# Patient Record
Sex: Male | Born: 1990 | Race: White | Hispanic: No | Smoking: Never smoker
Health system: Southern US, Community
[De-identification: ages and names within clinical notes are randomized; demographics above are authoritative.]

## PROBLEM LIST (undated history)

## (undated) DIAGNOSIS — Z22322 Carrier or suspected carrier of Methicillin resistant Staphylococcus aureus: Secondary | ICD-10-CM

## (undated) HISTORY — PX: DEBRIDEMENT LEG: SUR390

---

## 2015-02-13 ENCOUNTER — Encounter: Payer: Self-pay | Admitting: Emergency Medicine

## 2015-02-13 ENCOUNTER — Emergency Department
Admission: EM | Admit: 2015-02-13 | Discharge: 2015-02-13 | Disposition: A | Discharge status: Home / Self Care | Payer: BLUE CROSS/BLUE SHIELD | Source: Home / Self Care | Attending: Family Medicine | Admitting: Family Medicine

## 2015-02-13 ENCOUNTER — Emergency Department (INDEPENDENT_AMBULATORY_CARE_PROVIDER_SITE_OTHER): Payer: BLUE CROSS/BLUE SHIELD

## 2015-02-13 DIAGNOSIS — M779 Enthesopathy, unspecified: Principal | ICD-10-CM

## 2015-02-13 DIAGNOSIS — M25571 Pain in right ankle and joints of right foot: Secondary | ICD-10-CM | POA: Diagnosis not present

## 2015-02-13 DIAGNOSIS — M6588 Other synovitis and tenosynovitis, other site: Secondary | ICD-10-CM

## 2015-02-13 DIAGNOSIS — M778 Other enthesopathies, not elsewhere classified: Secondary | ICD-10-CM

## 2015-02-13 HISTORY — DX: Carrier or suspected carrier of methicillin resistant Staphylococcus aureus: Z22.322

## 2015-02-13 MED ORDER — MELOXICAM 15 MG PO TABS: 15.0000 mg | ORAL_TABLET | Freq: Every day | ORAL | Status: DC

## 2015-02-13 MED ORDER — TRAMADOL HCL 50 MG PO TABS: 50.0000 mg | ORAL_TABLET | Freq: Four times a day (QID) | ORAL | Status: DC | PRN

## 2015-02-13 NOTE — ED Notes (Signed)
Patient began feeling pain on top of right foot 4 days ago; no known injury or improper movement. Has tried tylenol and ibuprofen without relief.

## 2015-02-13 NOTE — Discharge Instructions (Signed)
Apply ice pack for 15 minutes, 3 to 4 times daily  Continue until pain decreases. Begin stretching and range of motion exercises.  Avoid lacing shoes too tightly.

## 2015-02-13 NOTE — ED Provider Notes (Signed)
CSN: [id number]     Arrival date & time 02/13/15  1747 History   First MD Initiated Contact with Patient 02/13/15 1805     Chief Complaint  Patient presents with  . Foot Pain      HPI Comments: Patient was at work four days ago when he noticed pain in the dorsum of his right foot at 7:30am.  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  He recalls no injury or change in activities.  The has persisted and is worse when standing and walking.  Patient is a 24 y.o. male presenting with lower extremity pain. The history is provided by the patient.  Foot Pain This is a new problem. Episode onset: 4 days ag. The problem occurs constantly. The problem has not changed since onset.The symptoms are aggravated by walking and standing. The symptoms are relieved by rest. He has tried acetaminophen (ibuprove) for the symptoms. The treatment provided no relief.    Past Medical History  Diagnosis Date  . MRSA (methicillin resistant staph aureus) culture positive    Past Surgical History  Procedure Laterality Date  . Debridement leg      for MRSA   Family History  Problem Relation Age of Onset  . Diabetes Father   . Diabetes Paternal Uncle    History  Substance Use Topics  . Smoking status: Never Smoker   . Smokeless tobacco: Not on file  . Alcohol Use: No    Review of Systems  All other systems reviewed and are negative.   Allergies  Review of patient's allergies indicates no known allergies.  Home Medications   Prior to Admission medications   Medication Sig Start Date End Date Taking? Authorizing Provider  meloxicam (MOBIC) 15 MG tablet Take 1 tablet (15 mg total) by mouth daily. Take with food 02/13/15   Lattie Haw, MD  traMADol (ULTRAM) 50 MG tablet Take 1 tablet (50 mg total) by mouth every 6 (six) hours as needed for moderate pain. 02/13/15   Lattie Haw, MD   BP 100/60 mmHg  Pulse 52  Temp(Src) 97.9 F (36.6 C) (Oral)  Resp 18  Ht  (1.88 m)   Wt 332 lb (150.594 kg)  BMI 42.61 kg/m2  SpO2 98% Physical Exam  Constitutional: He is oriented to person, place, and time. He appears well-developed and well-nourished. No distress.  Patient is obese (BMI 42.6)  HENT:  Head: Normocephalic.  Eyes: Pupils are equal, round, and reactive to light.  Musculoskeletal:       Right foot: There is tenderness and bony tenderness. There is normal range of motion, no swelling, normal capillary refill, no crepitus and no deformity.       Feet:  The dorsum of right foot has tenderness to palpation  as noted on diagram.  Pain is elicited by palpation of extensor tendons of 2nd, 3rd, and 4th toes, especially during resisted dorsiflexion of those toes.    Neurological: He is alert and oriented to person, place, and time.  Skin: Skin is warm and dry. No rash noted.  Nursing note and vitals reviewed.   ED Course  Procedures  none  Imaging Review Dg Foot Complete Right  02/13/2015   CLINICAL DATA:  Acute right foot pain without known injury. Initial encounter.  EXAM: RIGHT FOOT COMPLETE - 3+ VIEW  COMPARISON:  None.  FINDINGS: There is no evidence of fracture or dislocation. There is no evidence of arthropathy or other focal  bone abnormality. Soft tissues are unremarkable.  IMPRESSION: No significant abnormality seen in the right foot.   Electronically Signed   By: Lupita RaiderJames  Green Jr, M.D.   On: 02/13/2015 19:48     MDM   1. Extensor tendonitis of foot    Begin Mobic 15mg  daily, and tramadol 50mg  q6hr prn Apply ice pack for 15 minutes, 3 to 4 times daily  Continue until pain decreases. Begin stretching and range of motion exercises (instruction sheets given).  Avoid lacing shoes too tightly. Followup with Dr. Rodney Langtonhomas Thekkekandam ([facility]) if not improving about two weeks.     Lattie HawStephen A Shevette Bess, MD 02/15/15 [phone]

## 2015-02-21 ENCOUNTER — Ambulatory Visit (INDEPENDENT_AMBULATORY_CARE_PROVIDER_SITE_OTHER): Payer: BLUE CROSS/BLUE SHIELD | Admitting: Family Medicine

## 2015-02-21 ENCOUNTER — Encounter: Payer: Self-pay | Admitting: Family Medicine

## 2015-02-21 VITALS — BP 108/76 | HR 76 | Ht 74.0 in | Wt 330.0 lb

## 2015-02-21 DIAGNOSIS — M79671 Pain in right foot: Secondary | ICD-10-CM | POA: Diagnosis not present

## 2015-02-21 MED ORDER — TRAMADOL HCL 50 MG PO TABS: 50.0000 mg | ORAL_TABLET | Freq: Four times a day (QID) | ORAL | Status: DC | PRN

## 2015-02-21 MED ORDER — MELOXICAM 15 MG PO TABS: 15.0000 mg | ORAL_TABLET | Freq: Every day | ORAL | Status: DC

## 2015-02-21 NOTE — Patient Instructions (Signed)
You have an extensor digitorum tendinitis. Ice for 15 minutes at a time at least 3-4 times a day. Meloxicam 15 mg daily with food for total of 6 weeks. Postop shoe or cam boot if it feels better with either of these - wear as often as possible. Follow up with me in 4 weeks for reevaluation.

## 2015-02-22 DIAGNOSIS — M79671 Pain in right foot: Secondary | ICD-10-CM | POA: Insufficient documentation

## 2015-02-22 NOTE — Progress Notes (Signed)
PCP: No PCP Per Patient  Subjective:   HPI: Patient is a [age] y.o. male here for right foot pain.  Patient reports he started to get pain about 1 1/2 weeks ago dorsal aspect right foot. Noticed during and after long shifts at work where he stands and walks a lot (12 hour shifts) and has to push a pedal with right foot. + swelling. No known injury though. Tried meloxicam which has helped, icing, tramadol. No prior issues with this foot.  Past Medical History  Diagnosis Date  . MRSA (methicillin resistant staph aureus) culture positive     No current outpatient prescriptions on file prior to visit.   No current facility-administered medications on file prior to visit.    Past Surgical History  Procedure Laterality Date  . Debridement leg      for MRSA    No Known Allergies  History   Social History  . Marital Status: Single    Spouse Name: N/A  . Number of Children: N/A  . Years of Education: N/A   Occupational History  . Not on file.   Social History Main Topics  . Smoking status: Never Smoker   . Smokeless tobacco: Not on file  . Alcohol Use: No  . Drug Use: No  . Sexual Activity: Not on file   Other Topics Concern  . Not on file   Social History Narrative    Family History  Problem Relation Age of Onset  . Diabetes Father   . Diabetes Paternal Uncle     BP 108/76 mmHg  Pulse 76  Ht 6\' 2"  (1.88 m)  Wt 330 lb (149.687 kg)  BMI 42.35 kg/m2  Review of Systems: See HPI above.    Objective:  Physical Exam:  Gen: NAD  Right foot/ankle: No gross deformity, swelling, ecchymoses FROM with pain on dorsiflexion mildly of ankle. TTP dorsal foot over extensor tendons. Negative ant drawer and talar tilt.   Negative syndesmotic compression. Thompsons test negative. NV intact distally.  MSK u/s:  No evidence neovascularity, cortical irregularity, edema overlying cortex to suggest metatarsal stress fracture.  Does have a target sign of extensor  digitorum though.    Assessment & Plan:  1. Right foot pain - 2/2 extensor digitorum tendinitis.  Icing, meloxicam.  Postop shoe or cam walker as often as possible to help rest this.  F/u in 4 weeks.

## 2015-02-22 NOTE — Assessment & Plan Note (Signed)
2/2 extensor digitorum tendinitis.  Icing, meloxicam.  Postop shoe or cam walker as often as possible to help rest this.  F/u in 4 weeks.

## 2015-03-09 ENCOUNTER — Encounter: Payer: Self-pay | Admitting: Emergency Medicine

## 2015-03-09 ENCOUNTER — Emergency Department
Admission: EM | Admit: 2015-03-09 | Discharge: 2015-03-09 | Disposition: A | Discharge status: Home / Self Care | Payer: BLUE CROSS/BLUE SHIELD | Source: Home / Self Care | Attending: Emergency Medicine | Admitting: Emergency Medicine

## 2015-03-09 DIAGNOSIS — L237 Allergic contact dermatitis due to plants, except food: Secondary | ICD-10-CM

## 2015-03-09 MED ORDER — METHYLPREDNISOLONE SODIUM SUCC 125 MG IJ SOLR
125.0000 mg | INTRAMUSCULAR | Status: AC
  Administered 2015-03-09: 125 mg via INTRAMUSCULAR

## 2015-03-09 MED ORDER — PREDNISONE 10 MG (21) PO TBPK: ORAL_TABLET | ORAL | Status: DC

## 2015-03-09 MED ORDER — HYDROXYZINE HCL 10 MG PO TABS
10.0000 mg | ORAL_TABLET | Freq: Four times a day (QID) | ORAL | Status: DC | PRN
Start: 2015-03-09 — End: 2015-06-27

## 2015-03-09 NOTE — ED Notes (Signed)
Poison ivy, eyes, arms, legs, testicles x 2 days

## 2015-03-09 NOTE — ED Provider Notes (Signed)
CSN: [id number]     Arrival date & time 03/09/15  0845 History   First MD Initiated Contact with Patient 03/09/15 [phone]     Chief Complaint  Patient presents with  . Poison Ivy   (Consider location/radiation/quality/duration/timing/severity/associated sxs/prior Treatment) HPI Was working in a wooded area, 2 days ago, exposed to poison ivy and now has severe itchy poison ivy on face, arms and legs and abdomen and testicles. No cardiorespiratory symptoms. No other GU symptoms In the past, had severe poison ivy requiring a "cortisone shot and oral cortisone"  Past Medical History  Diagnosis Date  . MRSA (methicillin resistant staph aureus) culture positive    Past Surgical History  Procedure Laterality Date  . Debridement leg      for MRSA   Family History  Problem Relation Age of Onset  . Diabetes Father   . Diabetes Paternal Uncle    History  Substance Use Topics  . Smoking status: Never Smoker   . Smokeless tobacco: Not on file  . Alcohol Use: No    Review of Systems Remainder of Review of Systems negative for acute change except as noted in the HPI.  Allergies  Review of patient's allergies indicates not on file.  Home Medications   Prior to Admission medications   Medication Sig Start Date End Date Taking? Authorizing Provider  hydrOXYzine (ATARAX/VISTARIL) 10 MG tablet Take 1 tablet (10 mg total) by mouth every 6 (six) hours as needed for itching. May take 2 at bedtime. Caution: May cause drowsiness 03/09/15   Lajean Manesavid Massey, MD  meloxicam (MOBIC) 15 MG tablet Take 1 tablet (15 mg total) by mouth daily. Take with food 02/21/15   Lenda KelpShane R Hudnall, MD  predniSONE (STERAPRED UNI-PAK 21 TAB) 10 MG (21) TBPK tablet Take as directed for 6 days.--Take 6 on day 1, 5 on day 2, 4 on day 3, then 3 on day 4, then 2  on day 5, then 1 on day 6. 03/09/15   Lajean Manesavid Massey, MD  traMADol (ULTRAM) 50 MG tablet Take 1 tablet (50 mg total) by mouth every 6 (six) hours as needed for moderate pain.  02/21/15   Lenda KelpShane R Hudnall, MD   BP 121/72 mmHg  Pulse 43  Temp(Src) 98 F (36.7 C) (Oral)  Ht 6\' 2"  (1.88 m)  Wt 324 lb (146.965 kg)  BMI 41.58 kg/m2  SpO2 100% Physical Exam  Constitutional: He is oriented to person, place, and time. He appears well-developed and well-nourished. No distress.  HENT:  Head: Normocephalic and atraumatic.  Eyes: Conjunctivae and EOM are normal. Pupils are equal, round, and reactive to light. No scleral icterus.  Neck: Normal range of motion.  Cardiovascular: Normal rate.   Pulmonary/Chest: Effort normal. He has no decreased breath sounds. He has no wheezes.  Abdominal: He exhibits no distension.  Musculoskeletal: Normal range of motion.  Neurological: He is alert and oriented to person, place, and time.  Skin: Skin is warm.  Severe poison ivy rash on face neck arms and legs and trunk and external testicle area.  Psychiatric: He has a normal mood and affect.  Nursing note and vitals reviewed.   ED Course  Procedures (including critical care time) Labs Review Labs Reviewed - No data to display  Imaging Review No results found.   MDM   1. Allergic dermatitis due to poison ivy    severe Treatment options discussed, as well as risks, benefits, alternatives. Patient voiced understanding and agreement with the following plans:  Solu-Medrol  125 IM now. New Prescriptions   HYDROXYZINE (ATARAX/VISTARIL) 10 MG TABLET    Take 1 tablet (10 mg total) by mouth every 6 (six) hours as needed for itching. May take 2 at bedtime. Caution: May cause drowsiness   PREDNISONE (STERAPRED UNI-PAK 21 TAB) 10 MG (21) TBPK TABLET    Take as directed for 6 days.--Take 6 on day 1, 5 on day 2, 4 on day 3, then 3 on day 4, then 2  on day 5, then 1 on day 6.   Work note given. Other symptomatic care discussed. Preventative measures discussed for the future. Follow-up with your primary care doctor or dermatologist in 5-7 days if not improving, or sooner if symptoms  become worse. Precautions discussed. Red flags discussed. Questions invited and answered. Patient voiced understanding and agreement.   Lajean Manes, MD 03/09/15 [phone]

## 2015-03-20 ENCOUNTER — Telehealth: Payer: Self-pay | Admitting: Emergency Medicine

## 2015-03-21 ENCOUNTER — Ambulatory Visit: Payer: BLUE CROSS/BLUE SHIELD | Admitting: Family Medicine

## 2015-06-27 ENCOUNTER — Emergency Department
Admission: EM | Admit: 2015-06-27 | Discharge: 2015-06-27 | Disposition: A | Discharge status: Home / Self Care | Payer: BLUE CROSS/BLUE SHIELD | Source: Home / Self Care | Attending: Family Medicine | Admitting: Family Medicine

## 2015-06-27 ENCOUNTER — Encounter: Payer: Self-pay | Admitting: *Deleted

## 2015-06-27 DIAGNOSIS — L255 Unspecified contact dermatitis due to plants, except food: Secondary | ICD-10-CM

## 2015-06-27 MED ORDER — METHYLPREDNISOLONE SODIUM SUCC 125 MG IJ SOLR
125.0000 mg | Freq: Once | INTRAMUSCULAR | Status: AC
  Administered 2015-06-27: 125 mg via INTRAMUSCULAR

## 2015-06-27 MED ORDER — PREDNISONE 20 MG PO TABS: 20.0000 mg | ORAL_TABLET | Freq: Two times a day (BID) | ORAL | Status: DC

## 2015-06-27 NOTE — ED Provider Notes (Signed)
CSN: [id number]     Arrival date & time 06/27/15  1141 History   First MD Initiated Contact with Patient 06/27/15 1157     Chief Complaint  Patient presents with  . Rash   (Consider location/radiation/quality/duration/timing/severity/associated sxs/prior Treatment) HPI Comments: Patient came into contact with poison ivy while using a week eater five days ago.  He has developed a persistent pruritic rash on his forearms, and some on his chest.  Patient is a 24 y.o. male presenting with poison ivy. The history is provided by the patient.  Poison [place] This is a recurrent problem. Episode onset: 5 days ago. The problem occurs constantly. The problem has not changed since onset.Associated symptoms comments: none. Exacerbated by: heat. Nothing relieves the symptoms. Treatments tried: calamine lotion. The treatment provided no relief.    Past Medical History  Diagnosis Date  . MRSA (methicillin resistant staph aureus) culture positive    Past Surgical History  Procedure Laterality Date  . Debridement leg      for MRSA   Family History  Problem Relation Age of Onset  . Diabetes Father   . Diabetes Paternal Uncle    Social History  Substance Use Topics  . Smoking status: Never Smoker   . Smokeless tobacco: None  . Alcohol Use: No    Review of Systems  All other systems reviewed and are negative.   Allergies  Review of patient's allergies indicates no known allergies.  Home Medications   Prior to Admission medications   Medication Sig Start Date End Date Taking? Authorizing Provider  meloxicam (MOBIC) 15 MG tablet Take 1 tablet (15 mg total) by mouth daily. Take with food 02/21/15   Lenda Kelp, MD  predniSONE (DELTASONE) 20 MG tablet Take 1 tablet (20 mg total) by mouth 2 (two) times daily. Take with food. Begin taking Wednesday 06/28/15. 06/27/15   Lattie Haw, MD  traMADol (ULTRAM) 50 MG tablet Take 1 tablet (50 mg total) by mouth every 6 (six) hours as needed for  moderate pain. 02/21/15   Lenda Kelp, MD   BP 128/84 mmHg  Pulse 50  Temp(Src) 98.3 F (36.8 C) (Oral)  Resp 16  Wt 315 lb (142.883 kg)  SpO2 100% Physical Exam  Constitutional: He is oriented to person, place, and time. He appears well-developed and well-nourished. No distress.  HENT:  Head: Normocephalic.  Mouth/Throat: Oropharynx is clear and moist.  Eyes: Pupils are equal, round, and reactive to light.  Pulmonary/Chest: No respiratory distress.  Neurological: He is alert and oriented to person, place, and time.  Skin: Skin is warm and dry.     There is macular erythema on patient's forearms without swelling, warmth or tenderness to palpation.  Minimal erythema on upper chest.  Nursing note and vitals reviewed.   ED Course  Procedures  None   MDM   1. Rhus dermatitis    SoluMedrol  IM Begin prednisone burst tomorrow. May take non-sedating antihistamine such as Zyrtec for itching. Return if not improved one week    Lattie Haw, MD 06/27/15 1212

## 2015-06-27 NOTE — Discharge Instructions (Signed)
May take non-sedating antihistamine such as Zyrtec for itching.   Poison [employer] ivy is a inflammation of the skin (contact dermatitis) caused by touching the allergens on the leaves of the ivy plant following previous exposure to the plant. The rash usually appears 48 hours after exposure. The rash is usually bumps (papules) or blisters (vesicles) in a linear pattern. Depending on your own sensitivity, the rash may simply cause redness and itching, or it may also progress to blisters which may break open. These must be well cared for to prevent secondary bacterial (germ) infection, followed by scarring. Keep any open areas dry, clean, dressed, and covered with an antibacterial ointment if needed. The eyes may also get puffy. The puffiness is worst in the morning and gets better as the day progresses. This dermatitis usually heals without scarring, within 2 to 3 weeks without treatment. HOME CARE INSTRUCTIONS  Thoroughly wash with soap and water as soon as you have been exposed to poison ivy. You have about one half hour to remove the plant resin before it will cause the rash. This washing will destroy the oil or antigen on the skin that is causing, or will cause, the rash. Be sure to wash under your fingernails as any plant resin there will continue to spread the rash. Do not rub skin vigorously when washing affected area. Poison ivy cannot spread if no oil from the plant remains on your body. A rash that has progressed to weeping sores will not spread the rash unless you have not washed thoroughly. It is also important to wash any clothes you have been wearing as these may carry active allergens. The rash will return if you wear the unwashed clothing, even several days later. Avoidance of the plant in the future is the best measure. Poison ivy plant can be recognized by the number of leaves. Generally, poison ivy has three leaves with flowering branches on a single stem. Diphenhydramine may be purchased  over the counter and used as needed for itching. Do not drive with this medication if it makes you drowsy.Ask your caregiver about medication for children. SEEK MEDICAL CARE IF:  Open sores develop.  Redness spreads beyond area of rash.  You notice purulent (pus-like) discharge.  You have increased pain.  Other signs of infection develop (such as fever). Document Released: 10/25/2000 Document Revised: 01/20/2012 Document Reviewed: 04/07/2009 [facility] Patient Information 2015 [place], [place]. This information is not intended to replace advice given to you by your health care provider. Make sure you discuss any questions you have with your health care provider.

## 2015-06-27 NOTE — ED Notes (Signed)
Pt c/o poison ivy/oak exposure with rash to both arms x 2 days. Used Calamine lotion otc without relief.

## 2016-05-23 ENCOUNTER — Emergency Department
Admission: EM | Admit: 2016-05-23 | Discharge: 2016-05-23 | Disposition: A | Discharge status: Home / Self Care | Payer: BLUE CROSS/BLUE SHIELD | Source: Home / Self Care | Attending: Family Medicine | Admitting: Family Medicine

## 2016-05-23 ENCOUNTER — Encounter: Payer: Self-pay | Admitting: Emergency Medicine

## 2016-05-23 DIAGNOSIS — L255 Unspecified contact dermatitis due to plants, except food: Secondary | ICD-10-CM | POA: Diagnosis not present

## 2016-05-23 MED ORDER — PREDNISONE 20 MG PO TABS: ORAL_TABLET | ORAL | Status: DC

## 2016-05-23 MED ORDER — METHYLPREDNISOLONE SODIUM SUCC 125 MG IJ SOLR
125.0000 mg | Freq: Once | INTRAMUSCULAR | Status: AC
  Administered 2016-05-23: 125 mg via INTRAMUSCULAR

## 2016-05-23 NOTE — ED Provider Notes (Signed)
CSN: [id number]     Arrival date & time 05/23/16  1311 History   First MD Initiated Contact with Patient 05/23/16 1359     Chief Complaint  Patient presents with  . Rash      HPI Comments: Patient was using a string trimmer 3 days ago when he came into contact with poison ivy.  He has mild pruritic rash on his forearms and legs.  He feels well otherwise.  Patient is a 25 y.o. male presenting with poison ivy. The history is provided by the patient.  Poison Lajoyce Cornersvy This is a new problem. Episode onset: 3 days ago. The problem occurs constantly. The problem has been gradually worsening. Associated symptoms comments: none.    Past Medical History  Diagnosis Date  . MRSA (methicillin resistant staph aureus) culture positive    Past Surgical History  Procedure Laterality Date  . Debridement leg      for MRSA   Family History  Problem Relation Age of Onset  . Diabetes Father   . Diabetes Paternal Uncle    Social History  Substance Use Topics  . Smoking status: Never Smoker   . Smokeless tobacco: None  . Alcohol Use: 0.0 oz/week    0 Standard drinks or equivalent per week    Review of Systems  All other systems reviewed and are negative.   Allergies  Review of patient's allergies indicates no known allergies.  Home Medications   Prior to Admission medications   Medication Sig Start Date End Date Taking? Authorizing Provider  meloxicam (MOBIC) 15 MG tablet Take 1 tablet (15 mg total) by mouth daily. Take with food 02/21/15   Lenda KelpShane R Hudnall, MD  predniSONE (DELTASONE) 20 MG tablet Take one tab by mouth twice daily for 5 days, then one daily for 3 days. Take with food. 05/23/16   Lattie HawStephen A Beese, MD  traMADol (ULTRAM) 50 MG tablet Take 1 tablet (50 mg total) by mouth every 6 (six) hours as needed for moderate pain. 02/21/15   Lenda KelpShane R Hudnall, MD   Meds Ordered and Administered this Visit   Medications  methylPREDNISolone sodium succinate (SOLU-MEDROL) 125 mg/2 mL injection 125 mg  (not administered)    BP 138/83 mmHg  Pulse 62  Temp(Src) 98.6 F (37 C) (Oral)  Ht 6\' 2"  (1.88 m)  Wt 360 lb (163.295 kg)  BMI 46.20 kg/m2  SpO2 96% No data found.   Physical Exam  Constitutional: He is oriented to person, place, and time. He appears well-developed and well-nourished.  HENT:  Head: Atraumatic.  Nose: Nose normal.  Mouth/Throat: Oropharynx is clear and moist.  Eyes: Conjunctivae are normal. Pupils are equal, round, and reactive to light.  Neck: Neck supple.  Musculoskeletal: He exhibits no edema.  Lymphadenopathy:    He has no cervical adenopathy.  Neurological: He is alert and oriented to person, place, and time.  Skin: Skin is warm and dry.     Bilateral forearms have mild erythema without swelling or tenderness to palpation.  Scattered maculopapular erythematous lesions on legs and arms.  Nursing note and vitals reviewed.   ED Course  Procedures none  MDM   1. Rhus dermatitis    Administered SoluMedrol 125mg  IM Begin prednisone burst/taper tomorrow. May take non-sedating antihistamine such as Zyrtec for itching. Return if not improved one week    Lattie HawStephen A Beese, MD 05/23/16 1635

## 2016-05-23 NOTE — Discharge Instructions (Signed)
Begin prednisone tomorrow (Friday 05/24/16) May take non-sedating antihistamine such as Zyrtec for itching.   Poison [employer] Poison ivy is a rash caused by touching the leaves of the poison ivy plant. The rash often shows up 48 hours later. You might just have bumps, redness, and itching. Sometimes, blisters appear and break open. Your eyes may get puffy (swollen). Poison ivy often heals in 2 to 3 weeks without treatment. HOME CARE  If you touch poison ivy:  Wash your skin with soap and water right away. Wash under your fingernails. Do not rub the skin very hard.  Wash any clothes you were wearing.  Avoid poison ivy in the future. Poison ivy has 3 leaves on a stem.  Use medicine to help with itching as told by your doctor. Do not drive when you take this medicine.  Keep open sores dry, clean, and covered with a bandage and medicated cream, if needed.  Ask your doctor about medicine for children. GET HELP RIGHT AWAY IF:  You have open sores.  Redness spreads beyond the area of the rash.  There is yellowish white fluid (pus) coming from the rash.  Pain gets worse.  You have a temperature by mouth above 102 F (38.9 C), not controlled by medicine. MAKE SURE YOU:  Understand these instructions.  Will watch your condition.  Will get help right away if you are not doing well or get worse.   This information is not intended to replace advice given to you by your health care provider. Make sure you discuss any questions you have with your health care provider.   Document Released: 11/30/2010 Document Revised: 01/20/2012 Document Reviewed: 04/05/2015 Elsevier Interactive Patient Education [employer]2016 [employer].

## 2016-05-23 NOTE — ED Notes (Signed)
Poison ivy on hands, forearms since Tuesday, he was weed eating on Monday, states he is highly allergic and always needs prednisone to get rid of it. Very worried it will spread.

## 2016-10-11 ENCOUNTER — Encounter: Payer: Self-pay | Admitting: *Deleted

## 2016-10-11 ENCOUNTER — Emergency Department
Admission: EM | Admit: 2016-10-11 | Discharge: 2016-10-11 | Disposition: A | Discharge status: Home / Self Care | Payer: BLUE CROSS/BLUE SHIELD | Source: Home / Self Care | Attending: Family Medicine | Admitting: Family Medicine

## 2016-10-11 DIAGNOSIS — J069 Acute upper respiratory infection, unspecified: Secondary | ICD-10-CM

## 2016-10-11 DIAGNOSIS — R0981 Nasal congestion: Secondary | ICD-10-CM

## 2016-10-11 DIAGNOSIS — B9789 Other viral agents as the cause of diseases classified elsewhere: Secondary | ICD-10-CM

## 2016-10-11 MED ORDER — PSEUDOEPHEDRINE HCL 60 MG PO TABS
60.0000 mg | ORAL_TABLET | Freq: Four times a day (QID) | ORAL | 0 refills | Status: DC | PRN
Start: 2016-10-11 — End: 2016-11-08

## 2016-10-11 MED ORDER — FLUTICASONE PROPIONATE 50 MCG/ACT NA SUSP: 2.0000 | Freq: Every day | NASAL | 2 refills | Status: DC

## 2016-10-11 NOTE — Discharge Instructions (Signed)
°  You may take 400-600mg  Ibuprofen (Motrin) every 6-8 hours for fever and pain  Alternate with Tylenol  You may take 500mg  Tylenol every 4-6 hours as needed for fever and pain  You may also use warm saltwater gargles to help with sore throat. Follow-up with your primary care provider next week for recheck of symptoms if not improving.  Be sure to drink plenty of fluids and rest, at least 8hrs of sleep a night, preferably more while you are sick. Return urgent care or go to closest ER if you cannot keep down fluids/signs of dehydration, fever not reducing with Tylenol, difficulty breathing/wheezing, stiff neck, worsening condition, or other concerns (see below)   Your symptoms are likely due to a virus such as the common cold, however, if you developing worsening chest congestion with shortness of breath, persistent fever (>100.4*F) for 3 days, or symptoms not improving in 5-7 days, please call our office for assistance as an antibiotic may be called in for you.

## 2016-10-11 NOTE — ED Provider Notes (Signed)
CSN: [id number]     Arrival date & time 10/11/16  1416 History   First MD Initiated Contact with Patient 10/11/16 1507     Chief Complaint  Patient presents with  . Nasal Congestion   (Consider location/radiation/quality/duration/timing/severity/associated sxs/prior Treatment) HPI  Edward Parsons is a 25 y.o. male presenting to UC with c/o 2 days of gradually worsening nasal congestion and sinus pressure. He has not tried anything for his symptoms today but he did take Nyquil once yesterday. Denies fever, chills, n/v/d. Others at work have been sick.     Past Medical History:  Diagnosis Date  . MRSA (methicillin resistant staph aureus) culture positive    Past Surgical History:  Procedure Laterality Date  . DEBRIDEMENT LEG     for MRSA   Family History  Problem Relation Age of Onset  . Diabetes Father   . Diabetes Paternal Uncle    Social History  Substance Use Topics  . Smoking status: Never Smoker  . Smokeless tobacco: Never Used  . Alcohol use No    Review of Systems  Constitutional: Negative for chills and fever.  HENT: Positive for congestion, rhinorrhea and sinus pressure. Negative for ear pain, sore throat, trouble swallowing and voice change.   Respiratory: Positive for cough. Negative for shortness of breath.   Cardiovascular: Negative for chest pain and palpitations.  Gastrointestinal: Negative for abdominal pain, diarrhea, nausea and vomiting.  Musculoskeletal: Negative for arthralgias, back pain and myalgias.  Skin: Negative for rash.  Neurological: Negative for dizziness, light-headedness and headaches.    Allergies  Patient has no known allergies.  Home Medications   Prior to Admission medications   Medication Sig Start Date End Date Taking? Authorizing Provider  fluticasone (FLONASE) 50 MCG/ACT nasal spray Place 2 sprays into both nostrils daily. 10/11/16   Junius FinnerErin O'Malley, PA-C  pseudoephedrine (SUDAFED) 60 MG tablet Take 1 tablet (60 mg total) by mouth  every 6 (six) hours as needed. 10/11/16   Junius FinnerErin O'Malley, PA-C   Meds Ordered and Administered this Visit  Medications - No data to display  BP 129/91 (BP Location: Left Arm)   Pulse 69   Temp 98.1 F (36.7 C) (Oral)   Resp 18   Ht 6\' 2"  (1.88 m)   Wt (!) 380 lb (172.4 kg)   SpO2 96%   BMI 48.79 kg/m  No data found.   Physical Exam  Constitutional: He is oriented to person, place, and time. He appears well-developed and well-nourished. No distress.  HENT:  Head: Normocephalic and atraumatic.  Right Ear: Tympanic membrane normal.  Left Ear: Tympanic membrane normal.  Nose: Nose normal. Right sinus exhibits no maxillary sinus tenderness and no frontal sinus tenderness. Left sinus exhibits no maxillary sinus tenderness and no frontal sinus tenderness.  Mouth/Throat: Uvula is midline, oropharynx is clear and moist and mucous membranes are normal.  Eyes: EOM are normal.  Neck: Normal range of motion. Neck supple.  Cardiovascular: Normal rate and regular rhythm.   Pulmonary/Chest: Effort normal and breath sounds normal. No respiratory distress. He has no wheezes. He has no rales.  Musculoskeletal: Normal range of motion.  Neurological: He is alert and oriented to person, place, and time.  Skin: Skin is warm and dry. He is not diaphoretic.  Psychiatric: He has a normal mood and affect. His behavior is normal.  Nursing note and vitals reviewed.   Urgent Care Course   Clinical Course     Procedures (including critical care time)  Labs Review Labs  Reviewed - No data to display  Imaging Review No results found.   MDM   1. Nasal congestion   2. Viral URI with cough    Pt c/o 2 days of URI symptoms. No evidence of bacterial infection at this time. Encouraged symptomatic treatment.  Rx: flonase and sudafed Encouraged sinus rinses F/u with PCP in 7-10 days if not improving. Sooner if worsening.     Junius Finnerrin O'Malley, PA-C 10/11/16 1914

## 2016-10-11 NOTE — ED Triage Notes (Signed)
Pt c/o nasal congestion and sinus pressure x 2 days. Denies fever.

## 2016-10-12 ENCOUNTER — Telehealth: Payer: Self-pay | Admitting: Emergency Medicine

## 2016-10-12 NOTE — Telephone Encounter (Signed)
Pt was seen yesterday, this morning coughing up dark brown mucous this morning.  Can an antb be called in?  Please advise.  TMartin,CMA

## 2016-10-12 NOTE — Telephone Encounter (Signed)
Per Denny PeonErin, a z-pack was called into [facility] [address] [place] use as directed.  Also to take Mucinex OTC to help with cough.  If no better will f/u w/PCP.  TMartin,CMA

## 2016-11-08 ENCOUNTER — Encounter: Payer: Self-pay | Admitting: *Deleted

## 2016-11-08 ENCOUNTER — Emergency Department
Admission: EM | Admit: 2016-11-08 | Discharge: 2016-11-08 | Disposition: A | Discharge status: Home / Self Care | Payer: BLUE CROSS/BLUE SHIELD | Source: Home / Self Care | Attending: Family Medicine | Admitting: Family Medicine

## 2016-11-08 DIAGNOSIS — L03811 Cellulitis of head [any part, except face]: Secondary | ICD-10-CM

## 2016-11-08 MED ORDER — DOXYCYCLINE HYCLATE 100 MG PO CAPS: 100.0000 mg | ORAL_CAPSULE | Freq: Two times a day (BID) | ORAL | 0 refills | Status: DC

## 2016-11-08 NOTE — ED Triage Notes (Signed)
Patient c/o bump that developed on top left side of his head 2 weeks ago shortly after a haircut. It has grown in size since and now has pain that radiates down his head into his neck. No drainage. C/o some recent fatigue. 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

## 2016-11-08 NOTE — ED Provider Notes (Signed)
CSN: [id number]     Arrival date & time 11/08/16  1018 History   First MD Initiated Contact with Patient 11/08/16 1052     Chief Complaint  Patient presents with  . Mass   (Consider location/radiation/quality/duration/timing/severity/associated sxs/prior Treatment) HPI Edward Parsons is a 25 y.o. male presenting to UC with c/o gradually worsening sore swollen area on the Left side top of his head that started about 2 weeks ago after a haircut.  Pain is aching and sore, mild to moderate in severity, worse with palpation. He has tried to squeeze it but no bleeding or discharge. Pain does radiate to behind his Left ear. He has been taking BC powder with mild relief.  He notes he must wear a respirator at work, which fits over the top of his head. He tries to position it so it does not rub the area but notes it is difficulty to keep it off that affected area.  Denies fever or chills.  Hx of MRSA in the past per his medical records.    Past Medical History:  Diagnosis Date  . MRSA (methicillin resistant staph aureus) culture positive    Past Surgical History:  Procedure Laterality Date  . DEBRIDEMENT LEG     for MRSA   Family History  Problem Relation Age of Onset  . Diabetes Father   . Diabetes Paternal Uncle    Social History  Substance Use Topics  . Smoking status: Never Smoker  . Smokeless tobacco: Never Used  . Alcohol use No    Review of Systems  Constitutional: Negative for chills and fever.  HENT: Negative for facial swelling and sore throat.   Gastrointestinal: Negative for nausea and vomiting.  Musculoskeletal: Negative for arthralgias and myalgias.  Skin: Positive for color change. Negative for wound.  Neurological: Positive for headaches (Left top of head-sore). Negative for dizziness and light-headedness.    Allergies  Patient has no known allergies.  Home Medications   Prior to Admission medications   Medication Sig Start Date End Date Taking? Authorizing  Provider  doxycycline (VIBRAMYCIN) 100 MG capsule Take 1 capsule (100 mg total) by mouth 2 (two) times daily. One po bid x 7 days 11/08/16   Junius FinnerErin O'Malley, PA-C   Meds Ordered and Administered this Visit  Medications - No data to display  BP 131/75 (BP Location: Left Arm)   Pulse 80   Temp 98.4 F (36.9 C) (Oral)   Wt (!) 387 lb (175.5 kg)   SpO2 96%   BMI 49.69 kg/m  No data found.   Physical Exam  Constitutional: He is oriented to person, place, and time. He appears well-developed and well-nourished.  HENT:  Head: Normocephalic. Head is with abrasion. Head is without contusion.    1cm are of faint erythema, mild swelling. Tender. Two superficial abrasions. No fluctuance. No bleeding or discharge.   Eyes: EOM are normal.  Neck: Normal range of motion. Neck supple.  Cardiovascular: Normal rate.   Pulmonary/Chest: Effort normal.  Musculoskeletal: Normal range of motion.  Neurological: He is alert and oriented to person, place, and time.  Skin: Skin is warm and dry. There is erythema ( see HENT exam).  Psychiatric: He has a normal mood and affect. His behavior is normal.  Nursing note and vitals reviewed.   Urgent Care Course   Clinical Course     Procedures (including critical care time)  Labs Review Labs Reviewed - No data to display  Imaging Review No results found.  MDM   1. Cellulitis of scalp    Exam c/w mild cellulitis of scalp. No indication for I&D at this time.  Will treat with oral antibiotic- Doxycycline Home care instructions provided. F/u in 4-5 days if not improving, sooner if worsening.  Advised not to try to squeeze it anymore. May clean with warm water and soap, especially after having to wear his respirator at work.     Junius Finnerrin O'Malley, PA-C 11/08/16 1105

## 2017-03-15 ENCOUNTER — Encounter: Payer: Self-pay | Admitting: Emergency Medicine

## 2017-03-15 ENCOUNTER — Emergency Department (INDEPENDENT_AMBULATORY_CARE_PROVIDER_SITE_OTHER)
Admission: EM | Admit: 2017-03-15 | Discharge: 2017-03-15 | Disposition: A | Discharge status: Home / Self Care | Payer: BLUE CROSS/BLUE SHIELD | Source: Home / Self Care | Attending: Family Medicine | Admitting: Family Medicine

## 2017-03-15 DIAGNOSIS — M5441 Lumbago with sciatica, right side: Secondary | ICD-10-CM | POA: Diagnosis not present

## 2017-03-15 MED ORDER — METHOCARBAMOL 500 MG PO TABS: 500.0000 mg | ORAL_TABLET | Freq: Two times a day (BID) | ORAL | 0 refills | Status: DC

## 2017-03-15 MED ORDER — TRAMADOL HCL 50 MG PO TABS: 50.0000 mg | ORAL_TABLET | Freq: Four times a day (QID) | ORAL | 0 refills | Status: DC | PRN

## 2017-03-15 MED ORDER — MELOXICAM 15 MG PO TABS: 15.0000 mg | ORAL_TABLET | Freq: Every day | ORAL | 0 refills | Status: DC

## 2017-03-15 NOTE — ED Provider Notes (Signed)
CSN: [id number]     Arrival date & time 03/15/17  0908 History   First MD Initiated Contact with Patient 03/15/17 [phone]     Chief Complaint  Patient presents with  . Back Pain   (Consider location/radiation/quality/duration/timing/severity/associated sxs/prior Treatment) HPI Edward Parsons is a 26 y.o. male presenting to UC with c/o sudden onset, gradually worsening Right lower back pain that started yesterday while working in a garden.  He notes he was tilling the soil then used a tool to pull the soil back. He thinks this is when he injured his back.  Pain is aching and sharp. Pain is 10/10, kept him up last night. Minimal relief with Icy-hot and ibuprofen.  He took 800mg  ibuprofen this morning just PTA.  Denies numbness down legs but pain does radiate from Right lower back to Right thigh.  No hx of back surgeries but has had back pain before.    Past Medical History:  Diagnosis Date  . MRSA (methicillin resistant staph aureus) culture positive    Past Surgical History:  Procedure Laterality Date  . DEBRIDEMENT LEG     for MRSA   Family History  Problem Relation Age of Onset  . Diabetes Father   . Diabetes Paternal Uncle    Social History  Substance Use Topics  . Smoking status: Never Smoker  . Smokeless tobacco: Never Used  . Alcohol use No    Review of Systems  Musculoskeletal: Positive for back pain and myalgias. Negative for arthralgias and gait problem.  Skin: Negative for color change and rash.  Neurological: Negative for weakness and numbness.    Allergies  Patient has no known allergies.  Home Medications   Prior to Admission medications   Medication Sig Start Date End Date Taking? Authorizing Provider  meloxicam (MOBIC) 15 MG tablet Take 1 tablet (15 mg total) by mouth daily. 03/15/17   Junius Finner'Malley, Koi Yarbro, PA-C  methocarbamol (ROBAXIN) 500 MG tablet Take 1 tablet (500 mg total) by mouth 2 (two) times daily. 03/15/17   Junius Finner'Malley, Lakeyta Vandenheuvel, PA-C  traMADol (ULTRAM) 50 MG tablet  Take 1 tablet (50 mg total) by mouth every 6 (six) hours as needed. 03/15/17   Junius Finner'Malley, Megha Agnes, PA-C   Meds Ordered and Administered this Visit  Medications - No data to display  BP 111/64 (BP Location: Left Arm)   Pulse (!) 53   Temp 98.8 F (37.1 C) (Oral)   Ht 6\' 2"  (1.88 m)   Wt (!) 347 lb (157.4 kg)   SpO2 95%   BMI 44.55 kg/m  No data found.   Physical Exam  Constitutional: He is oriented to person, place, and time. He appears well-developed and well-nourished. No distress.  Obese male sitting on exam chair, NAD.  HENT:  Head: Normocephalic and atraumatic.  Eyes: EOM are normal.  Neck: Normal range of motion.  Cardiovascular: Normal rate.   Pulmonary/Chest: Effort normal.  Musculoskeletal: Normal range of motion. He exhibits tenderness. He exhibits no edema.  Antalgic gait. Increased pain when pt moved from chair to exam table.  Tenderness to Right lower lumbar muscles. No spinal tenderness. Increased pain with extension of Right knee.   Neurological: He is alert and oriented to person, place, and time.  Skin: Skin is warm and dry. No rash noted. He is not diaphoretic. No erythema.  Psychiatric: He has a normal mood and affect. His behavior is normal.  Nursing note and vitals reviewed.   Urgent Care Course     Procedures (including critical care  time)  Labs Review Labs Reviewed - No data to display  Imaging Review No results found.    MDM   1. Acute right-sided low back pain with right-sided sciatica    Pain appears to be muscular in nature. No bony tenderness. No red flag symptoms. No indication for imaging at this time.  Rx: Robaxin, Tramadol, and meloxicam  f/u with Sports Medicine or PCP in 1 week if not improving.    Junius Finner, PA-C 03/15/17 1002

## 2017-03-15 NOTE — ED Triage Notes (Signed)
Pt c/o pulled muscle in lower back yesterday while planting flowers in the garden.  Took Ibuprofen.

## 2017-03-15 NOTE — Discharge Instructions (Signed)
°  Methocarbamol (Robaxin) is a muscle relaxer and may cause drowsiness. Do not drink alcohol, drive, or operate heavy machinery while taking.  Tramadol is strong pain medication. While taking, do not drink alcohol, drive, or perform any other activities that requires focus while taking these medications.   Meloxicam (Mobic) is an antiinflammatory to help with pain and inflammation.  Do not take ibuprofen, Advil, Aleve, or any other medications that contain NSAIDs while taking meloxicam as this may cause stomach upset or even ulcers if taken in large amounts for an extended period of time.

## 2018-01-25 ENCOUNTER — Encounter: Payer: Self-pay | Admitting: Emergency Medicine

## 2018-01-25 ENCOUNTER — Emergency Department (INDEPENDENT_AMBULATORY_CARE_PROVIDER_SITE_OTHER)
Admission: EM | Admit: 2018-01-25 | Discharge: 2018-01-25 | Disposition: A | Discharge status: Home / Self Care | Payer: BLUE CROSS/BLUE SHIELD | Source: Home / Self Care | Attending: Family Medicine | Admitting: Family Medicine

## 2018-01-25 DIAGNOSIS — R69 Illness, unspecified: Secondary | ICD-10-CM

## 2018-01-25 DIAGNOSIS — J111 Influenza due to unidentified influenza virus with other respiratory manifestations: Secondary | ICD-10-CM

## 2018-01-25 LAB — POCT RAPID STREP A (OFFICE): Rapid Strep A Screen: NEGATIVE

## 2018-01-25 MED ORDER — OSELTAMIVIR PHOSPHATE 75 MG PO CAPS: 75.0000 mg | ORAL_CAPSULE | Freq: Two times a day (BID) | ORAL | 0 refills | Status: DC

## 2018-01-25 NOTE — ED Provider Notes (Signed)
[name]    CSN: [id number] Arrival date & time: 01/25/18  1349     History   Chief Complaint Chief Complaint  Patient presents with  . URI    HPI Edward Parsons is a 27 y.o. male.   Complains of 2.5 day history flu-like illness including myalgias, chills, fatigue, and cough.  Also has mild nasal congestion and sore throat.  Cough is non-productive and somewhat worse at night.  No pleuritic pain or shortness of breath.  He has been exposed to a family member who has the flu this week.   The history is provided by the patient.    Past Medical History:  Diagnosis Date  . MRSA (methicillin resistant staph aureus) culture positive     Patient Active Problem List   Diagnosis Date Noted  . Right foot pain 02/22/2015    Past Surgical History:  Procedure Laterality Date  . DEBRIDEMENT LEG     for MRSA       Home Medications    Prior to Admission medications   Medication Sig Start Date End Date Taking? Authorizing Provider  oseltamivir (TAMIFLU) 75 MG capsule Take 1 capsule (75 mg total) by mouth every 12 (twelve) hours. 01/25/18   Lattie Haw, MD    Family History Family History  Problem Relation Age of Onset  . Diabetes Father   . Diabetes Paternal Uncle     Social History Social History   Tobacco Use  . Smoking status: Never Smoker  . Smokeless tobacco: Never Used  Substance Use Topics  . Alcohol use: No    Alcohol/week: 0.0 oz  . Drug use: No     Allergies   Patient has no known allergies.   Review of Systems Review of Systems + sore throat + cough No pleuritic pain No wheezing + nasal congestion + post-nasal drainage No sinus pain/pressure No itchy/red eyes No earache No hemoptysis No SOB No fever, + chills No nausea No vomiting No abdominal pain No diarrhea No urinary symptoms No skin rash + fatigue + myalgias No headache Used OTC meds without relief   Physical Exam Triage Vital Signs ED Triage Vitals    Enc Vitals Group     BP 01/25/18 1524 122/86     Pulse Rate 01/25/18 1524 (!) 51     Resp --      Temp 01/25/18 1524 98.4 F (36.9 C)     Temp Source 01/25/18 1524 Oral     SpO2 01/25/18 1524 99 %     Weight 01/25/18 1526 (!) 321 lb 8 oz (145.8 kg)     Height 01/25/18 1526 6\' 2"  (1.88 m)     Head Circumference --      Peak Flow --      Pain Score 01/25/18 1526 0     Pain Loc --      Pain Edu? --      Excl. in GC? --    No data found.  Updated Vital Signs BP 122/86 (BP Location: Right Arm)   Pulse (!) 51   Temp 98.4 F (36.9 C) (Oral)   Ht 6\' 2"  (1.88 m)   Wt (!) 321 lb 8 oz (145.8 kg)   SpO2 99%   BMI 41.28 kg/m   Visual Acuity Right Eye Distance:   Left Eye Distance:   Bilateral Distance:    Right Eye Near:   Left Eye Near:    Bilateral Near:     Physical Exam Nursing  notes and Vital Signs reviewed. Appearance:  Patient appears stated age, and in no acute distress Eyes:  Pupils are equal, round, and reactive to light and accomodation.  Extraocular movement is intact.  Conjunctivae are not inflamed  Ears:  Canals normal.  Tympanic membranes normal.  Nose:  Mildly congested turbinates.  No sinus tenderness.   Pharynx:   Erythematous, without exudate. Neck:  Supple.  Enlarged posterior/lateral nodes are palpated bilaterally, tender to palpation on the left.   Lungs:  Clear to auscultation.  Breath sounds are equal.  Moving air well. Heart:  Regular rate and rhythm without murmurs, rubs, or gallops.  Abdomen:  Nontender without masses or hepatosplenomegaly.  Bowel sounds are present.  No CVA or flank tenderness.  Extremities:  No edema.  Skin:  No rash present.    UC Treatments / Results  Labs (all labs ordered are listed, but only abnormal results are displayed) Labs Reviewed  STREP A DNA PROBE  POCT RAPID STREP A (OFFICE) negative    EKG  EKG Interpretation None       Radiology No results found.  Procedures Procedures (including critical care  time)  Medications Ordered in UC Medications - No data to display   Initial Impression / Assessment and Plan / UC Course  I have reviewed the triage vital signs and the nursing notes.  Pertinent labs & imaging results that were available during my care of the patient were reviewed by me and considered in my medical decision making (see chart for details).    Begin Tamiflu. Take plain guaifenesin (1200mg  extended release tabs such as Mucinex) twice daily, with plenty of water, for cough and congestion.  May add Pseudoephedrine (30mg , one or two every 4 to 6 hours) for sinus congestion.  Get adequate rest.   May use Afrin nasal spray (or generic oxymetazoline) each morning for about 5 days and then discontinue.  Also recommend using saline nasal spray several times daily and saline nasal irrigation (AYR is a common brand).  Use Flonase nasal spray each morning after using Afrin nasal spray and saline nasal irrigation. Try warm salt water gargles for sore throat.  Stop all antihistamines for now, and other non-prescription cough/cold preparations. May take Ibuprofen 200mg , 4 tabs every 8 hours with food for sore throat, fever, headache, etc. May take Delsym Cough Suppressant at bedtime for nighttime cough.  Followup with Family Doctor if not improved in about 5 days.    Final Clinical Impressions(s) / UC Diagnoses   Final diagnoses:  Influenza-like illness    ED Discharge Orders        Ordered    oseltamivir (TAMIFLU) 75 MG capsule  Every 12 hours     01/25/18 1642         Lattie HawBeese, Rafael Quesada A, MD 01/26/18 1257

## 2018-01-25 NOTE — Discharge Instructions (Signed)
Take plain guaifenesin (1200mg  extended release tabs such as Mucinex) twice daily, with plenty of water, for cough and congestion.  May add Pseudoephedrine (30mg , one or two every 4 to 6 hours) for sinus congestion.  Get adequate rest.   May use Afrin nasal spray (or generic oxymetazoline) each morning for about 5 days and then discontinue.  Also recommend using saline nasal spray several times daily and saline nasal irrigation (AYR is a common brand).  Use Flonase nasal spray each morning after using Afrin nasal spray and saline nasal irrigation. Try warm salt water gargles for sore throat.  Stop all antihistamines for now, and other non-prescription cough/cold preparations. May take Ibuprofen 200mg , 4 tabs every 8 hours with food for sore throat, fever, headache, etc. May take Delsym Cough Suppressant at bedtime for nighttime cough.

## 2018-01-25 NOTE — ED Triage Notes (Signed)
Patient complaining of fatigue x 1 day, runny nose, non-productive cough x 3 days and sore throat.

## 2018-01-26 ENCOUNTER — Telehealth: Payer: Self-pay | Admitting: Emergency Medicine

## 2018-01-26 LAB — STREP A DNA PROBE: GROUP A STREP PROBE: NOT DETECTED

## 2018-04-27 ENCOUNTER — Encounter: Payer: Self-pay | Admitting: *Deleted

## 2018-04-27 ENCOUNTER — Emergency Department (INDEPENDENT_AMBULATORY_CARE_PROVIDER_SITE_OTHER)
Admission: EM | Admit: 2018-04-27 | Discharge: 2018-04-27 | Disposition: A | Discharge status: Home / Self Care | Payer: BLUE CROSS/BLUE SHIELD | Source: Home / Self Care | Attending: Family Medicine | Admitting: Family Medicine

## 2018-04-27 ENCOUNTER — Other Ambulatory Visit: Payer: Self-pay

## 2018-04-27 DIAGNOSIS — L255 Unspecified contact dermatitis due to plants, except food: Secondary | ICD-10-CM

## 2018-04-27 MED ORDER — METHYLPREDNISOLONE SODIUM SUCC 125 MG IJ SOLR
80.0000 mg | Freq: Once | INTRAMUSCULAR | Status: AC
  Administered 2018-04-27: 80 mg via INTRAMUSCULAR

## 2018-04-27 MED ORDER — PREDNISONE 20 MG PO TABS: ORAL_TABLET | ORAL | 0 refills | Status: DC

## 2018-04-27 NOTE — Discharge Instructions (Signed)
Begin prednisone Tuesday 04/28/18. May take a non-sedating antihistamine such as Zyrtec for itching.

## 2018-04-27 NOTE — ED Provider Notes (Signed)
[facility]    CSN: [id number] Arrival date & time: 04/27/18  1056     History   Chief Complaint Chief Complaint  Patient presents with  . Rash    HPI Edward Parsons is a 27 y.o. male.   Patient lifted a tree limb last night that had poison ivy on it.  This morning he noticed a pruritic rash arms on his arms, and is not developing some rash on his face.  The history is provided by the patient.  Poison Lajoyce Cornersvy  This is a new problem. The current episode started yesterday. The problem occurs constantly. The problem has been gradually worsening. Nothing aggravates the symptoms. Nothing relieves the symptoms. Treatments tried: calamine lotion. The treatment provided mild relief.    Past Medical History:  Diagnosis Date  . MRSA (methicillin resistant staph aureus) culture positive     Patient Active Problem List   Diagnosis Date Noted  . Right foot pain 02/22/2015    Past Surgical History:  Procedure Laterality Date  . DEBRIDEMENT LEG     for MRSA       Home Medications    Prior to Admission medications   Medication Sig Start Date End Date Taking? Authorizing Provider  predniSONE (DELTASONE) 20 MG tablet Take one tab by mouth twice daily for 4 days, then one daily for 3 days. Take with food. 04/27/18   Lattie HawBeese, Stephen A, MD    Family History Family History  Problem Relation Age of Onset  . Diabetes Father   . Diabetes Paternal Uncle     Social History Social History   Tobacco Use  . Smoking status: Never Smoker  . Smokeless tobacco: Never Used  Substance Use Topics  . Alcohol use: No    Alcohol/week: 0.0 oz  . Drug use: No     Allergies   Patient has no known allergies.   Review of Systems Review of Systems  All other systems reviewed and are negative.    Physical Exam Triage Vital Signs ED Triage Vitals [04/27/18 1113]  Enc Vitals Group     BP 138/84     Pulse Rate 73     Resp 18     Temp 97.8 F (36.6 C)     Temp Source Oral       SpO2 99 %     Weight (!) 332 lb (150.6 kg)     Height 6\' 2"  (1.88 m)     Head Circumference      Peak Flow      Pain Score 0     Pain Loc      Pain Edu?      Excl. in GC?    No data found.  Updated Vital Signs BP 138/84 (BP Location: Right Arm)   Pulse 73   Temp 97.8 F (36.6 C) (Oral)   Resp 18   Ht 6\' 2"  (1.88 m)   Wt (!) 332 lb (150.6 kg)   SpO2 99%   BMI 42.63 kg/m   Visual Acuity Right Eye Distance:   Left Eye Distance:   Bilateral Distance:    Right Eye Near:   Left Eye Near:    Bilateral Near:     Physical Exam  Constitutional: He appears well-developed and well-nourished. No distress.  HENT:  Head: Normocephalic.  Right Ear: External ear normal.  Left Ear: External ear normal.  Nose: Nose normal.  Mouth/Throat: Oropharynx is clear and moist.  Eyes: Pupils are equal, round, and reactive  to light.  Cardiovascular: Normal rate.  Pulmonary/Chest: Effort normal.  Musculoskeletal: He exhibits no edema.  Neurological: He is alert.  Skin: Skin is warm and dry.     Small maculopapular linear erythematous lesions on forearms.  Few macular spots on face without swelling noted.  Nursing note and vitals reviewed.    UC Treatments / Results  Labs (all labs ordered are listed, but only abnormal results are displayed) Labs Reviewed - No data to display  EKG None  Radiology No results found.  Procedures Procedures (including critical care time)  Medications Ordered in UC Medications  methylPREDNISolone sodium succinate (SOLU-MEDROL) 125 mg/2 mL injection 80 mg (has no administration in time range)    Initial Impression / Assessment and Plan / UC Course  I have reviewed the triage vital signs and the nursing notes.  Pertinent labs & imaging results that were available during my care of the patient were reviewed by me and considered in my medical decision making (see chart for details).    Administered Solumedrol 80mg  IM. Followup with Family  Doctor if not improved in one week.     Final Clinical Impressions(s) / UC Diagnoses   Final diagnoses:  Rhus dermatitis     Discharge Instructions     Begin prednisone Tuesday 04/28/18. May take a non-sedating antihistamine such as Zyrtec for itching.    ED Prescriptions    Medication Sig Dispense Auth. Provider   predniSONE (DELTASONE) 20 MG tablet Take one tab by mouth twice daily for 4 days, then one daily for 3 days. Take with food. 11 tablet Lattie Haw, MD         Lattie Haw, MD 04/27/18 [phone]

## 2018-04-27 NOTE — ED Triage Notes (Signed)
Pt c/o itching rash on his arms bilaterally x last night.

## 2018-05-20 ENCOUNTER — Emergency Department (INDEPENDENT_AMBULATORY_CARE_PROVIDER_SITE_OTHER)
Admission: EM | Admit: 2018-05-20 | Discharge: 2018-05-20 | Disposition: A | Discharge status: Home / Self Care | Payer: BLUE CROSS/BLUE SHIELD | Source: Home / Self Care | Attending: Family Medicine | Admitting: Family Medicine

## 2018-05-20 ENCOUNTER — Other Ambulatory Visit: Payer: Self-pay

## 2018-05-20 ENCOUNTER — Encounter: Payer: Self-pay | Admitting: Emergency Medicine

## 2018-05-20 DIAGNOSIS — J029 Acute pharyngitis, unspecified: Secondary | ICD-10-CM | POA: Diagnosis not present

## 2018-05-20 DIAGNOSIS — L255 Unspecified contact dermatitis due to plants, except food: Secondary | ICD-10-CM

## 2018-05-20 MED ORDER — PREDNISONE 50 MG PO TABS: ORAL_TABLET | ORAL | 0 refills | Status: DC

## 2018-05-20 MED ORDER — PENICILLIN V POTASSIUM 500 MG PO TABS: ORAL_TABLET | ORAL | 0 refills | Status: DC

## 2018-05-20 NOTE — ED Provider Notes (Signed)
[name]    CSN: [id number] Arrival date & time: 05/20/18  1942     History   Chief Complaint Chief Complaint  Patient presents with  . Sore Throat  . Rash    4 days    HPI Katherine Syme is a 27 y.o. male.   Patient reports that he was treated for poison ivy several weeks ago, and improved initially.  He is again developing a pruritic rash on his left arm and lower legs.  He believes that he contacted poison ivy from his dog's fur. He also complains of awakening yesterday with a sore throat that has persisted.  No fevers, chills, and sweats.  He denies cough and nasal congestion.  The history is provided by the patient.    Past Medical History:  Diagnosis Date  . MRSA (methicillin resistant staph aureus) culture positive     Patient Active Problem List   Diagnosis Date Noted  . Right foot pain 02/22/2015    Past Surgical History:  Procedure Laterality Date  . DEBRIDEMENT LEG     for MRSA       Home Medications    Prior to Admission medications   Medication Sig Start Date End Date Taking? Authorizing Provider  penicillin v potassium (VEETID) 500 MG tablet Take one tab by mouth twice daily for 10 days 05/20/18   Lattie Haw, MD  predniSONE (DELTASONE) 50 MG tablet Take one tab by mouth with food once daily for five days 05/20/18   Lattie Haw, MD    Family History Family History  Problem Relation Age of Onset  . Diabetes Father   . Diabetes Paternal Uncle     Social History Social History   Tobacco Use  . Smoking status: Never Smoker  . Smokeless tobacco: Never Used  Substance Use Topics  . Alcohol use: No    Alcohol/week: 0.0 oz  . Drug use: No     Allergies   Patient has no known allergies.   Review of Systems Review of Systems + sore throat No cough No pleuritic pain No wheezing No nasal congestion + post-nasal drainage No sinus pain/pressure No itchy/red eyes No earache No hemoptysis No SOB No  fever/chills No nausea No vomiting No abdominal pain No diarrhea No urinary symptoms + skin rash + fatigue No myalgias No headache Used OTC meds without relief   Physical Exam Triage Vital Signs ED Triage Vitals [05/20/18 2000]  Enc Vitals Group     BP 127/87     Pulse Rate 81     Resp      Temp 98.9 F (37.2 C)     Temp Source Oral     SpO2 100 %     Weight (!) 337 lb (152.9 kg)     Height 6\' 2"  (1.88 m)     Head Circumference      Peak Flow      Pain Score 5     Pain Loc      Pain Edu?      Excl. in GC?    No data found.  Updated Vital Signs BP 127/87 (BP Location: Right Arm)   Pulse 81   Temp 98.9 F (37.2 C) (Oral)   Ht 6\' 2"  (1.88 m)   Wt (!) 337 lb (152.9 kg)   SpO2 100%   BMI 43.27 kg/m   Visual Acuity Right Eye Distance:   Left Eye Distance:   Bilateral Distance:    Right Eye  Near:   Left Eye Near:    Bilateral Near:     Physical Exam Nursing notes and Vital Signs reviewed. Appearance:  Patient appears stated age, and in no acute distress Eyes:  Pupils are equal, round, and reactive to light and accomodation.  Extraocular movement is intact.  Conjunctivae are not inflamed  Ears:  Canals normal.  Tympanic membranes normal.  Nose:   Normal turbinates.  No sinus tenderness.  Pharynx:   Enlarged erythematous tonsils with exudate. Neck:  Supple.  Tonsillar nodes are enlarged and tender to palpation.  Lungs:  Clear to auscultation.  Breath sounds are equal.  Moving air well. Heart:  Regular rate and rhythm without murmurs, rubs, or gallops.  Abdomen:  Nontender without masses or hepatosplenomegaly.  Bowel sounds are present.  No CVA or flank tenderness.  Extremities:  No edema.  Skin:   Punctate erythematous macules on left arm and lower legs.    UC Treatments / Results  Labs (all labs ordered are listed, but only abnormal results are displayed) Labs Reviewed  STREP A DNA PROBE  POCT RAPID STREP A (OFFICE) negative     EKG None  Radiology No results found.  Procedures Procedures (including critical care time)  Medications Ordered in UC Medications - No data to display  Initial Impression / Assessment and Plan / UC Course  I have reviewed the triage vital signs and the nursing notes.  Pertinent labs & imaging results that were available during my care of the patient were reviewed by me and considered in my medical decision making (see chart for details).    CENTOR 3. Throat culture pending.  Begin PenVK Begin prednisone burst for poison ivy. Followup with Family Doctor if not improved in 7 to 10 days.   Final Clinical Impressions(s) / UC Diagnoses   Final diagnoses:  Pharyngitis, unspecified etiology  Rhus dermatitis     Discharge Instructions     Try warm salt water gargles for sore throat.  May take Tylenol as needed for pain.    ED Prescriptions    Medication Sig Dispense Auth. Provider   penicillin v potassium (VEETID) 500 MG tablet Take one tab by mouth twice daily for 10 days 20 tablet Lattie HawBeese, Jasraj Lappe A, MD   predniSONE (DELTASONE) 50 MG tablet Take one tab by mouth with food once daily for five days 5 tablet Lattie HawBeese, Brian Kocourek A, MD         Lattie HawBeese, Devyn Griffing A, MD 05/21/18 1455

## 2018-05-20 NOTE — ED Triage Notes (Signed)
27 y.o male presents c/o sore throat and left arm rash. States he was seen a few weeks for for posion ivy and it didn't clear. His throat began hurting last night.

## 2018-05-20 NOTE — Discharge Instructions (Addendum)
Try warm salt water gargles for sore throat.  °May take Tylenol as needed for pain. °

## 2018-05-21 ENCOUNTER — Telehealth: Payer: Self-pay

## 2018-05-21 LAB — STREP A DNA PROBE: Group A Strep Probe: NOT DETECTED

## 2018-05-21 LAB — POCT RAPID STREP A (OFFICE): Rapid Strep A Screen: NEGATIVE

## 2018-05-21 NOTE — Telephone Encounter (Signed)
Left message with lab result and to call if any questions or concerns.

## 2018-10-11 ENCOUNTER — Other Ambulatory Visit: Payer: Self-pay

## 2018-10-11 ENCOUNTER — Emergency Department (INDEPENDENT_AMBULATORY_CARE_PROVIDER_SITE_OTHER)
Admission: EM | Admit: 2018-10-11 | Discharge: 2018-10-11 | Disposition: A | Discharge status: Home / Self Care | Payer: BLUE CROSS/BLUE SHIELD | Source: Home / Self Care | Attending: Family Medicine | Admitting: Family Medicine

## 2018-10-11 ENCOUNTER — Encounter: Payer: Self-pay | Admitting: Emergency Medicine

## 2018-10-11 DIAGNOSIS — L247 Irritant contact dermatitis due to plants, except food: Secondary | ICD-10-CM | POA: Diagnosis not present

## 2018-10-11 MED ORDER — PREDNISONE 50 MG PO TABS: 50.0000 mg | ORAL_TABLET | Freq: Every day | ORAL | 0 refills | Status: AC

## 2018-10-11 MED ORDER — TRIAMCINOLONE ACETONIDE 0.1 % EX CREA: 1.0000 "application " | TOPICAL_CREAM | Freq: Two times a day (BID) | CUTANEOUS | 0 refills | Status: DC

## 2018-10-11 MED ORDER — PREDNISONE 50 MG PO TABS: 50.0000 mg | ORAL_TABLET | Freq: Every day | ORAL | 0 refills | Status: DC

## 2018-10-11 MED ORDER — METHYLPREDNISOLONE SODIUM SUCC 40 MG IJ SOLR
80.0000 mg | Freq: Once | INTRAMUSCULAR | Status: AC
  Administered 2018-10-11: 80 mg via INTRAMUSCULAR

## 2018-10-11 NOTE — Discharge Instructions (Signed)
°  You were given a shot of solumedrol (a steroid) today to help with itching and swelling from a likely allergic reaction.  You have been prescribed 5 days of prednisone, an oral steroid.  You may start this medication tomorrow with breakfast.    Please follow up with family medicine in 1 week if needed.

## 2018-10-11 NOTE — ED Provider Notes (Signed)
[facility]    CSN: [id number] Arrival date & time: 10/11/18  1336     History   Chief Complaint Chief Complaint  Patient presents with  . Poison Ivy    HPI Edward Parsons is a 27 y.o. male.   HPI Edward Parsons is a 27 y.o. male presenting to UC with c/o itchy red rash on Left forearm, hands and around Right upper eyelid that started about 3 days ago. 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 Hx of similar rashes due to poison ivy exposure. He has tried hydrocortisone cream but only temporary relief from itch but rash continues to spread. Denies oral swelling or SOB. He typically receives a steroid shot and prednisone pills to help with his reactions.    Past Medical History:  Diagnosis Date  . MRSA (methicillin resistant staph aureus) culture positive     Patient Active Problem List   Diagnosis Date Noted  . Right foot pain 02/22/2015    Past Surgical History:  Procedure Laterality Date  . DEBRIDEMENT LEG     for MRSA       Home Medications    Prior to Admission medications   Medication Sig Start Date End Date Taking? Authorizing Provider  predniSONE (DELTASONE) 50 MG tablet Take 1 tablet (50 mg total) by mouth daily with breakfast for 5 days. 10/11/18 10/16/18  Lurene ShadowPhelps, Vick Filter O, PA-C  triamcinolone cream (KENALOG) 0.1 % Apply 1 application topically 2 (two) times daily. 10/11/18   Lurene ShadowPhelps, Dolphus Linch O, PA-C    Family History Family History  Problem Relation Age of Onset  . Diabetes Father   . Diabetes Paternal Uncle     Social History Social History   Tobacco Use  . Smoking status: Never Smoker  . Smokeless tobacco: Never Used  Substance Use Topics  . Alcohol use: No    Alcohol/week: 0.0 standard drinks  . Drug use: No     Allergies   Patient has no known allergies.   Review of Systems Review of Systems  HENT: Negative for facial swelling and trouble swallowing.   Respiratory: Negative for chest tightness,  shortness of breath and wheezing.   Gastrointestinal: Negative for nausea and vomiting.  Skin: Positive for rash. Negative for wound.     Physical Exam Triage Vital Signs ED Triage Vitals [10/11/18 1412]  Enc Vitals Group     BP 101/63     Pulse Rate (!) 56     Resp      Temp 98.2 F (36.8 C)     Temp Source Oral     SpO2 98 %     Weight (!) 356 lb (161.5 kg)     Height 6' (1.829 m)     Head Circumference      Peak Flow      Pain Score      Pain Loc      Pain Edu?      Excl. in GC?    No data found.  Updated Vital Signs BP 101/63 (BP Location: Left Arm)   Pulse (!) 56   Temp 98.2 F (36.8 C) (Oral)   Ht 6' (1.829 m)   Wt (!) 356 lb (161.5 kg)   SpO2 98%   BMI 48.28 kg/m   Visual Acuity Right Eye Distance:   Left Eye Distance:   Bilateral Distance:    Right Eye Near:   Left Eye Near:    Bilateral Near:  Physical Exam  Constitutional: He is oriented to person, place, and time. He appears well-developed and well-nourished.  HENT:  Head: Normocephalic and atraumatic.  No oral swelling.  Eyes: EOM are normal.  Neck: Normal range of motion.  Cardiovascular: Normal rate.  Pulmonary/Chest: Effort normal.  Musculoskeletal: Normal range of motion.  Neurological: He is alert and oriented to person, place, and time.  Skin: Skin is warm and dry. Capillary refill takes less than 2 seconds. Rash noted. There is erythema.  Right upper eyelid: faint erythematous papular rash. No discharge. Left forearm and bilateral hands: erythematous maculopapular rash, does blanch. No discharge.   Psychiatric: He has a normal mood and affect. His behavior is normal.  Nursing note and vitals reviewed.    UC Treatments / Results  Labs (all labs ordered are listed, but only abnormal results are displayed) Labs Reviewed - No data to display  EKG None  Radiology No results found.  Procedures Procedures (including critical care time)  Medications Ordered in  UC Medications  methylPREDNISolone sodium succinate (SOLU-MEDROL) 40 mg/mL injection 80 mg (has no administration in time range)    Initial Impression / Assessment and Plan / UC Course  I have reviewed the triage vital signs and the nursing notes.  Pertinent labs & imaging results that were available during my care of the patient were reviewed by me and considered in my medical decision making (see chart for details).     Hx and exam c/w contact dermatitis w/o secondary infection.  Will tx with steroids Home care info provided.  Final Clinical Impressions(s) / UC Diagnoses   Final diagnoses:  Contact dermatitis and eczema due to plant     Discharge Instructions      You were given a shot of solumedrol (a steroid) today to help with itching and swelling from a likely allergic reaction.  You have been prescribed 5 days of prednisone, an oral steroid.  You may start this medication tomorrow with breakfast.    Please follow up with family medicine in 1 week if needed.     ED Prescriptions    Medication Sig Dispense Auth. Provider   predniSONE (DELTASONE) 50 MG tablet  (Status: Discontinued) Take 1 tablet (50 mg total) by mouth daily with breakfast for 5 days. 5 tablet Doroteo Glassman, Toneshia Coello O, PA-C   predniSONE (DELTASONE) 50 MG tablet Take 1 tablet (50 mg total) by mouth daily with breakfast for 5 days. 5 tablet Waylan Rocher O, PA-C   triamcinolone cream (KENALOG) 0.1 % Apply 1 application topically 2 (two) times daily. 30 g Lurene Shadow, PA-C     Controlled Substance Prescriptions [place] Controlled Substance Registry consulted? Not Applicable   Rolla Plate 10/11/18 1441

## 2018-10-11 NOTE — ED Triage Notes (Signed)
Here with possible poison oak. Started a few days ago. Red areas noted to left arm spreading to hands and right upper eyelid. Hx recurrent related to freq work exposure. Tried Hydrocortisone cream

## 2019-01-05 ENCOUNTER — Emergency Department (INDEPENDENT_AMBULATORY_CARE_PROVIDER_SITE_OTHER)
Admission: EM | Admit: 2019-01-05 | Discharge: 2019-01-05 | Disposition: A | Discharge status: Home / Self Care | Payer: BLUE CROSS/BLUE SHIELD | Source: Home / Self Care | Attending: Family Medicine | Admitting: Family Medicine

## 2019-01-05 ENCOUNTER — Encounter: Payer: Self-pay | Admitting: *Deleted

## 2019-01-05 ENCOUNTER — Other Ambulatory Visit: Payer: Self-pay

## 2019-01-05 DIAGNOSIS — B9789 Other viral agents as the cause of diseases classified elsewhere: Secondary | ICD-10-CM

## 2019-01-05 DIAGNOSIS — J069 Acute upper respiratory infection, unspecified: Secondary | ICD-10-CM | POA: Diagnosis not present

## 2019-01-05 DIAGNOSIS — H6592 Unspecified nonsuppurative otitis media, left ear: Secondary | ICD-10-CM

## 2019-01-05 MED ORDER — BENZONATATE 200 MG PO CAPS: ORAL_CAPSULE | ORAL | 0 refills | Status: DC

## 2019-01-05 MED ORDER — AMOXICILLIN 875 MG PO TABS: 875.0000 mg | ORAL_TABLET | Freq: Two times a day (BID) | ORAL | 0 refills | Status: DC

## 2019-01-05 NOTE — ED Provider Notes (Signed)
[facility]    CSN: [id number] Arrival date & time: 01/05/19  0810     History   Chief Complaint Chief Complaint  Patient presents with  . Cough    HPI Lofton Digiuseppe is a 28 y.o. male.   Patient complains of two day history of typical cold-like symptoms including mild sore throat, sinus congestion, fatigue, chills, and cough.  Today his left ear has felt clogged.  The history is provided by the patient.    Past Medical History:  Diagnosis Date  . MRSA (methicillin resistant staph aureus) culture positive     Patient Active Problem List   Diagnosis Date Noted  . Right foot pain 02/22/2015    Past Surgical History:  Procedure Laterality Date  . DEBRIDEMENT LEG     for MRSA       Home Medications    Prior to Admission medications   Medication Sig Start Date End Date Taking? Authorizing Provider  amoxicillin (AMOXIL) 875 MG tablet Take 1 tablet (875 mg total) by mouth 2 (two) times daily. 01/05/19   Lattie Haw, MD  benzonatate (TESSALON) 200 MG capsule Take one cap by mouth at bedtime as needed for cough.  May repeat in 4 to 6 hours 01/05/19   Lattie Haw, MD    Family History Family History  Problem Relation Age of Onset  . Diabetes Father   . Diabetes Paternal Uncle     Social History Social History   Tobacco Use  . Smoking status: Never Smoker  . Smokeless tobacco: Never Used  Substance Use Topics  . Alcohol use: No    Alcohol/week: 0.0 standard drinks  . Drug use: No     Allergies   Patient has no known allergies.   Review of Systems Review of Systems + sore throat + cough No pleuritic pain No wheezing + nasal congestion + post-nasal drainage + sinus pain/pressure No itchy/red eyes ? earache No hemoptysis No SOB No fever, + chills No nausea No vomiting No abdominal pain No diarrhea No urinary symptoms No skin rash + fatigue No myalgias No headache Used OTC meds without relief   Physical  Exam Triage Vital Signs ED Triage Vitals  Enc Vitals Group     BP 01/05/19 0829 (!) 131/91     Pulse Rate 01/05/19 0829 67     Resp 01/05/19 0829 18     Temp 01/05/19 0829 98.1 F (36.7 C)     Temp Source 01/05/19 0829 Oral     SpO2 01/05/19 0829 98 %     Weight 01/05/19 0830 (!) 362 lb (164.2 kg)     Height 01/05/19 0830 6\' 2"  (1.88 m)     Head Circumference --      Peak Flow --      Pain Score 01/05/19 0830 0     Pain Loc --      Pain Edu? --      Excl. in GC? --    No data found.  Updated Vital Signs BP (!) 131/91 (BP Location: Right Arm)   Pulse 67   Temp 98.1 F (36.7 C) (Oral)   Resp 18   Ht 6\' 2"  (1.88 m)   Wt (!) 164.2 kg   SpO2 98%   BMI 46.48 kg/m   Visual Acuity Right Eye Distance:   Left Eye Distance:   Bilateral Distance:    Right Eye Near:   Left Eye Near:    Bilateral Near:  Physical Exam Nursing notes and Vital Signs reviewed. Appearance:  Patient appears stated age, and in no acute distress Eyes:  Pupils are equal, round, and reactive to light and accomodation.  Extraocular movement is intact.  Conjunctivae are not inflamed  Ears:  Right canal partly occluded with cerumen, but right tympanic membrane appears normal.  Left canal normal.  Left tympanic membrane is erythematous and bulging. Nose:  Congested turbinates, more pronounced on the left.  No sinus tenderness.  Pharynx:  Normal Neck:  Supple.  Enlarged posterior/lateral nodes are palpated bilaterally, tender to palpation on the left.   Lungs:  Clear to auscultation.  Breath sounds are equal.  Moving air well. Heart:  Regular rate and rhythm without murmurs, rubs, or gallops.  Abdomen:  Nontender without masses or hepatosplenomegaly.  Bowel sounds are present.  No CVA or flank tenderness.  Extremities:  No edema.  Skin:  No rash present.    UC Treatments / Results  Labs (all labs ordered are listed, but only abnormal results are displayed) Labs Reviewed - No data to  display  EKG None  Radiology No results found.  Procedures Procedures (including critical care time)  Medications Ordered in UC Medications - No data to display  Initial Impression / Assessment and Plan / UC Course  I have reviewed the triage vital signs and the nursing notes.  Pertinent labs & imaging results that were available during my care of the patient were reviewed by me and considered in my medical decision making (see chart for details).    Begin amoxicillin for 10 days.  Prescription written for Benzonatate [facility]) to take at bedtime for night-time cough.  Followup with Family Doctor if not improved in 10 days.   Final Clinical Impressions(s) / UC Diagnoses   Final diagnoses:  Viral URI with cough  Left otitis media with effusion     Discharge Instructions     Take plain guaifenesin (1200mg  extended release tabs such as Mucinex) twice daily, with plenty of water, for cough and congestion.  Continue Pseudoephedrine (30mg , one or two every 4 to 6 hours) for sinus congestion.  Get adequate rest.   May use Afrin nasal spray (or generic oxymetazoline) each morning for about 5 days and then discontinue.  Also recommend using saline nasal spray several times daily and saline nasal irrigation (AYR is a common brand).  Use Flonase nasal spray each morning after using Afrin nasal spray and saline nasal irrigation. Try warm salt water gargles for sore throat.  Stop all antihistamines for now, and other non-prescription cough/cold preparations. May take Ibuprofen 200mg , 4 tabs every 8 hours with food for headache, fever, etc. May take Delsym Cough Suppressant with Tessalon at bedtime for nighttime cough.     ED Prescriptions    Medication Sig Dispense Auth. Provider   amoxicillin (AMOXIL) 875 MG tablet Take 1 tablet (875 mg total) by mouth 2 (two) times daily. 20 tablet Lattie Haw, MD   benzonatate (TESSALON) 200 MG capsule Take one cap by mouth at bedtime as  needed for cough.  May repeat in 4 to 6 hours 15 capsule Cathren Harsh Tera Mater, MD         Lattie Haw, MD 01/05/19 [phone]

## 2019-01-05 NOTE — ED Triage Notes (Signed)
Pt c/o post nasal drip and nasal congestion x 2 days with a non-productive cough x today. Denies fever. He has taken Sudafed.

## 2019-01-05 NOTE — Discharge Instructions (Addendum)
Take plain guaifenesin (1200mg  extended release tabs such as Mucinex) twice daily, with plenty of water, for cough and congestion.  Continue Pseudoephedrine (30mg , one or two every 4 to 6 hours) for sinus congestion.  Get adequate rest.   May use Afrin nasal spray (or generic oxymetazoline) each morning for about 5 days and then discontinue.  Also recommend using saline nasal spray several times daily and saline nasal irrigation (AYR is a common brand).  Use Flonase nasal spray each morning after using Afrin nasal spray and saline nasal irrigation. Try warm salt water gargles for sore throat.  Stop all antihistamines for now, and other non-prescription cough/cold preparations. May take Ibuprofen 200mg , 4 tabs every 8 hours with food for headache, fever, etc. May take Delsym Cough Suppressant with Tessalon at bedtime for nighttime cough.

## 2019-01-25 ENCOUNTER — Encounter: Payer: Self-pay | Admitting: *Deleted

## 2019-01-25 ENCOUNTER — Emergency Department (INDEPENDENT_AMBULATORY_CARE_PROVIDER_SITE_OTHER)
Admission: EM | Admit: 2019-01-25 | Discharge: 2019-01-25 | Disposition: A | Discharge status: Home / Self Care | Payer: BLUE CROSS/BLUE SHIELD | Source: Home / Self Care

## 2019-01-25 DIAGNOSIS — L255 Unspecified contact dermatitis due to plants, except food: Secondary | ICD-10-CM | POA: Diagnosis not present

## 2019-01-25 MED ORDER — PREDNISONE 20 MG PO TABS: ORAL_TABLET | ORAL | 0 refills | Status: DC

## 2019-01-25 NOTE — Discharge Instructions (Signed)
°  It is recommended that you take a daily allergy medication such as Zyrtec, Claritin or Allegra to help with allergies, itching and rash. Generic forms of these medications are okay too.  Due to the frequency that you get poison ivy, and for overall health, it is recommended that you establish care with a primary care provider. Once established, they would be able to refill prednisone more easily/provide multiple refills if they felt necessary.

## 2019-01-25 NOTE — ED Provider Notes (Addendum)
[name]    CSN: [id number] Arrival date & time: 01/25/19  0847     History   Chief Complaint Chief Complaint  Patient presents with  . Rash    HPI Edward Parsons is a 28 y.o. male.   HPI Edward Parsons is a 28 y.o. male presenting to UC with c/o itchy erythematous rash on his Left leg and bilateral flanks that started about 2 days ago. Rash c/w prior episodes of exposure to poison ivy. Pt believes he got it from his dog who was playing in the brush a few days ago and then slept in the same bed.  He has not tried any OTC medications because pt notes oral prednisone is the only thing that helps.    Past Medical History:  Diagnosis Date  . MRSA (methicillin resistant staph aureus) culture positive     Patient Active Problem List   Diagnosis Date Noted  . Right foot pain 02/22/2015    Past Surgical History:  Procedure Laterality Date  . DEBRIDEMENT LEG     for MRSA       Home Medications    Prior to Admission medications   Medication Sig Start Date End Date Taking? Authorizing Provider  amoxicillin (AMOXIL) 875 MG tablet Take 1 tablet (875 mg total) by mouth 2 (two) times daily. 01/05/19   Lattie Haw, MD  benzonatate (TESSALON) 200 MG capsule Take one cap by mouth at bedtime as needed for cough.  May repeat in 4 to 6 hours 01/05/19   Lattie Haw, MD  predniSONE (DELTASONE) 20 MG tablet 3 tabs po day one, then 2 po daily x 4 days 01/25/19   Lurene Shadow, PA-C    Family History Family History  Problem Relation Age of Onset  . Diabetes Father   . Diabetes Paternal Uncle   . Healthy Mother     Social History Social History   Tobacco Use  . Smoking status: Never Smoker  . Smokeless tobacco: Never Used  Substance Use Topics  . Alcohol use: No    Alcohol/week: 0.0 standard drinks  . Drug use: No     Allergies   Patient has no known allergies.   Review of Systems Review of Systems  Constitutional: Negative for chills and fever.   HENT: Negative for sore throat.   Respiratory: Negative for shortness of breath and wheezing.   Skin: Positive for rash. Negative for wound.     Physical Exam Triage Vital Signs ED Triage Vitals  Enc Vitals Group     BP 01/25/19 0904 118/82     Pulse Rate 01/25/19 0904 68     Resp 01/25/19 0904 16     Temp 01/25/19 0904 98.1 F (36.7 C)     Temp Source 01/25/19 0904 Oral     SpO2 01/25/19 0904 97 %     Weight 01/25/19 0905 (!) 360 lb (163.3 kg)     Height 01/25/19 0905 6\' 2"  (1.88 m)     Head Circumference --      Peak Flow --      Pain Score 01/25/19 0905 0     Pain Loc --      Pain Edu? --      Excl. in GC? --    No data found.  Updated Vital Signs BP 118/82 (BP Location: Left Arm)   Pulse 68   Temp 98.1 F (36.7 C) (Oral)   Resp 16   Ht 6\' 2"  (1.88 m)  Wt (!) 360 lb (163.3 kg)   SpO2 97%   BMI 46.22 kg/m   Visual Acuity Right Eye Distance:   Left Eye Distance:   Bilateral Distance:    Right Eye Near:   Left Eye Near:    Bilateral Near:     Physical Exam Vitals signs and nursing note reviewed.  Constitutional:      Appearance: Normal appearance. He is well-developed.  HENT:     Head: Normocephalic and atraumatic.     Nose: Nose normal.     Mouth/Throat:     Mouth: Mucous membranes are moist.  Neck:     Musculoskeletal: Normal range of motion.  Cardiovascular:     Rate and Rhythm: Normal rate.  Pulmonary:     Effort: Pulmonary effort is normal.  Musculoskeletal: Normal range of motion.  Skin:    General: Skin is warm and dry.     Findings: Erythema and rash present.          Comments: Several areas of erythematous maculopapular rash in linear distribution on flanks and left lower leg. Rash blanches. Non-tender. No discharge.  Neurological:     Mental Status: He is alert and oriented to person, place, and time.  Psychiatric:        Behavior: Behavior normal.      UC Treatments / Results  Labs (all labs ordered are listed, but only  abnormal results are displayed) Labs Reviewed - No data to display  EKG None  Radiology No results found.  Procedures Procedures (including critical care time)  Medications Ordered in UC Medications - No data to display  Initial Impression / Assessment and Plan / UC Course  I have reviewed the triage vital signs and the nursing notes.  Pertinent labs & imaging results that were available during my care of the patient were reviewed by me and considered in my medical decision making (see chart for details).     Hx and exam c/w uncomplicated contact dermaitits PO prednisone sent to pharmacy Strongly encouraged to establish care with a PCP for ongoing healthcare needs.  Final Clinical Impressions(s) / UC Diagnoses   Final diagnoses:  Contact dermatitis due to plant     Discharge Instructions      It is recommended that you take a daily allergy medication such as Zyrtec, Claritin or Allegra to help with allergies, itching and rash. Generic forms of these medications are okay too.  Due to the frequency that you get poison ivy, and for overall health, it is recommended that you establish care with a primary care provider. Once established, they would be able to refill prednisone more easily/provide multiple refills if they felt necessary.     ED Prescriptions    Medication Sig Dispense Auth. Provider   predniSONE (DELTASONE) 20 MG tablet 3 tabs po day one, then 2 po daily x 4 days 11 tablet Lurene Shadow, PA-C     Controlled Substance Prescriptions [place] Controlled Substance Registry consulted? Not Applicable   Rolla Plate 01/25/19 1042    [address], [place] 01/25/19 1043

## 2019-01-25 NOTE — ED Triage Notes (Signed)
Rash to bilat. Legs and flanks x 2 days.

## 2019-01-29 ENCOUNTER — Emergency Department (INDEPENDENT_AMBULATORY_CARE_PROVIDER_SITE_OTHER)
Admission: EM | Admit: 2019-01-29 | Discharge: 2019-01-29 | Disposition: A | Discharge status: Home / Self Care | Payer: BLUE CROSS/BLUE SHIELD | Source: Home / Self Care

## 2019-01-29 ENCOUNTER — Other Ambulatory Visit: Payer: Self-pay

## 2019-01-29 ENCOUNTER — Encounter: Payer: Self-pay | Admitting: Family Medicine

## 2019-01-29 DIAGNOSIS — J40 Bronchitis, not specified as acute or chronic: Secondary | ICD-10-CM | POA: Diagnosis not present

## 2019-01-29 MED ORDER — HYDROCODONE-HOMATROPINE 5-1.5 MG/5ML PO SYRP: 5.0000 mL | ORAL_SOLUTION | Freq: Four times a day (QID) | ORAL | 0 refills | Status: DC | PRN

## 2019-01-29 MED ORDER — AZITHROMYCIN 250 MG PO TABS: ORAL_TABLET | ORAL | 0 refills | Status: DC

## 2019-01-29 MED ORDER — SPACER/AERO-HOLDING CHAMBERS DEVI: 1.0000 | Freq: Four times a day (QID) | 1 refills | Status: DC | PRN

## 2019-01-29 MED ORDER — ALBUTEROL SULFATE HFA 108 (90 BASE) MCG/ACT IN AERS: 2.0000 | INHALATION_SPRAY | RESPIRATORY_TRACT | 1 refills | Status: DC | PRN

## 2019-01-29 NOTE — ED Provider Notes (Signed)
[name]    CSN: [id number] Arrival date & time: 01/29/19  0825     History   Chief Complaint Chief Complaint  Patient presents with  . Cough    HPI Edward Parsons is a 28 y.o. male.   28 yo patient seen here 4 days ago with contact dermatitis and started on PO prednisone.  He had also been seen a month ago and diagnosed with bronchitis and left otitis media, and started on amoxicillin at that time.  Today he presents with congested cough.  Patient started coughing on Monday, the same day he presented for rash.  He has no h/o asthma or use of inhaler.  He has not had a fever or shortness of breath.  The cough is nonproductive.  No chest pain is present, and there is minimal sinus congestion.  He works for [employer] and is off work until Monday        Past Medical History:  Diagnosis Date  . MRSA (methicillin resistant staph aureus) culture positive     Patient Active Problem List   Diagnosis Date Noted  . Right foot pain 02/22/2015    Past Surgical History:  Procedure Laterality Date  . DEBRIDEMENT LEG     for MRSA       Home Medications    Prior to Admission medications   Medication Sig Start Date End Date Taking? Authorizing Provider  albuterol (PROVENTIL HFA;VENTOLIN HFA) 108 (90 Base) MCG/ACT inhaler Inhale 2 puffs into the lungs every 4 (four) hours as needed for wheezing or shortness of breath (cough, shortness of breath or wheezing.). 01/29/19   Elvina Sidle, MD  azithromycin (ZITHROMAX) 250 MG tablet Take 2 tabs PO x 1 dose, then 1 tab PO QD x 4 days 01/29/19   Elvina Sidle, MD  HYDROcodone-homatropine (HYDROMET) 5-1.5 MG/5ML syrup Take 5 mLs by mouth every 6 (six) hours as needed for cough. 01/29/19   Elvina Sidle, MD  predniSONE (DELTASONE) 20 MG tablet 3 tabs po day one, then 2 po daily x 4 days 01/25/19   Lurene Shadow, PA-C  Spacer/Aero-Holding Chambers DEVI 1 Device by Does not apply route every 6 (six) hours as  needed. 01/29/19   Elvina Sidle, MD    Family History Family History  Problem Relation Age of Onset  . Diabetes Father   . Diabetes Paternal Uncle   . Healthy Mother     Social History Social History   Tobacco Use  . Smoking status: Never Smoker  . Smokeless tobacco: Never Used  Substance Use Topics  . Alcohol use: No    Alcohol/week: 0.0 standard drinks  . Drug use: No     Allergies   Patient has no known allergies.   Review of Systems Review of Systems  Constitutional: Negative for chills, diaphoresis, fatigue and fever.  HENT: Negative.   Respiratory: Positive for cough and wheezing. Negative for shortness of breath.   Cardiovascular: Negative.   Gastrointestinal: Negative.   Genitourinary: Negative.   Neurological: Negative.   All other systems reviewed and are negative.    Physical Exam Triage Vital Signs ED Triage Vitals  Enc Vitals Group     BP      Pulse      Resp      Temp      Temp src      SpO2      Weight      Height      Head Circumference  Peak Flow      Pain Score      Pain Loc      Pain Edu?      Excl. in GC?    No data found.  Updated Vital Signs BP 126/82 (BP Location: Right Arm)   Pulse 65   Temp 98.4 F (36.9 C) (Oral)   Resp 16   Ht 6\' 2"  (1.88 m)   Wt (!) 163.6 kg   SpO2 96%   BMI 46.31 kg/m    Physical Exam Vitals signs and nursing note reviewed.  Constitutional:      Appearance: Normal appearance. He is obese.  HENT:     Head: Normocephalic.     Right Ear: Tympanic membrane normal.     Left Ear: There is impacted cerumen.     Nose: Nose normal.     Mouth/Throat:     Mouth: Mucous membranes are moist.     Pharynx: Oropharynx is clear.  Eyes:     Extraocular Movements: Extraocular movements intact.     Conjunctiva/sclera: Conjunctivae normal.  Neck:     Musculoskeletal: Normal range of motion and neck supple.  Cardiovascular:     Rate and Rhythm: Normal rate and regular rhythm.     Heart sounds:  Normal heart sounds.  Pulmonary:     Effort: Pulmonary effort is normal.     Breath sounds: Rhonchi and rales present.     Comments: Bilateral wheezes and ronchi Musculoskeletal: Normal range of motion.  Skin:    General: Skin is warm and dry.  Neurological:     General: No focal deficit present.     Mental Status: He is alert and oriented to person, place, and time.  Psychiatric:        Mood and Affect: Mood normal.        Behavior: Behavior normal.      UC Treatments / Results  Labs (all labs ordered are listed, but only abnormal results are displayed) Labs Reviewed - No data to display  EKG None  Radiology No results found.  Procedures Procedures (including critical care time)  Medications Ordered in UC Medications - No data to display  Initial Impression / Assessment and Plan / UC Course  I have reviewed the triage vital signs and the nursing notes.  Pertinent labs & imaging results that were available during my care of the patient were reviewed by me and considered in my medical decision making (see chart for details).    Final Clinical Impressions(s) / UC Diagnoses   Final diagnoses:  Bronchitis   Discharge Instructions   None    ED Prescriptions    Medication Sig Dispense Auth. Provider   azithromycin (ZITHROMAX) 250 MG tablet Take 2 tabs PO x 1 dose, then 1 tab PO QD x 4 days 6 tablet Elvina Sidle, MD   HYDROcodone-homatropine (HYDROMET) 5-1.5 MG/5ML syrup Take 5 mLs by mouth every 6 (six) hours as needed for cough. 60 mL Elvina Sidle, MD   albuterol (PROVENTIL HFA;VENTOLIN HFA) 108 (90 Base) MCG/ACT inhaler Inhale 2 puffs into the lungs every 4 (four) hours as needed for wheezing or shortness of breath (cough, shortness of breath or wheezing.). 1 Inhaler Elvina Sidle, MD   Spacer/Aero-Holding Chambers DEVI 1 Device by Does not apply route every 6 (six) hours as needed. 1 each Elvina Sidle, MD     Controlled Substance Prescriptions [place]  Controlled Substance Registry consulted? Not Applicable   Elvina Sidle, MD 01/29/19 [phone]

## 2019-01-29 NOTE — ED Triage Notes (Signed)
Patient reports cough and congestion in chest developing over past 4 days; denies fever; has been on prednisone for 'poison ivy' since 01/25/19. Patient has not had influenza vacc this season; he has not travelled in past 2-3 weeks.

## 2019-03-08 ENCOUNTER — Other Ambulatory Visit: Payer: Self-pay

## 2019-03-08 ENCOUNTER — Emergency Department (INDEPENDENT_AMBULATORY_CARE_PROVIDER_SITE_OTHER)
Admission: EM | Admit: 2019-03-08 | Discharge: 2019-03-08 | Disposition: A | Discharge status: Home / Self Care | Payer: BLUE CROSS/BLUE SHIELD | Source: Home / Self Care | Attending: Emergency Medicine | Admitting: Emergency Medicine

## 2019-03-08 DIAGNOSIS — L237 Allergic contact dermatitis due to plants, except food: Secondary | ICD-10-CM | POA: Diagnosis not present

## 2019-03-08 LAB — POCT FASTING CBG KUC MANUAL ENTRY: POCT Glucose (KUC): 93 mg/dL (ref 70–99)

## 2019-03-08 MED ORDER — PREDNISONE 20 MG PO TABS: ORAL_TABLET | ORAL | 0 refills | Status: DC

## 2019-03-08 MED ORDER — METHYLPREDNISOLONE SODIUM SUCC 40 MG IJ SOLR
40.0000 mg | Freq: Once | INTRAMUSCULAR | Status: AC
  Administered 2019-03-08: 40 mg via INTRAMUSCULAR

## 2019-03-08 NOTE — ED Provider Notes (Signed)
[facility]    CSN: [id number] Arrival date & time: 03/08/19  [id number]     History   Chief Complaint Chief Complaint  Patient presents with  . Rash    HPI Edward Parsons is a 28 y.o. male.  HPI Patient has a history of recurrent contact dermatitis.  He was last exposed approximately 3-1/2 weeks ago.  He did an ED visit and was prescribed prednisone for 5 days.  He seemed to be improving and then symptoms return.  He has been treated 3 times over the last 5 months for contact dermatitis.  He does have a dog and states her is a lot of poison ivy in the back of the house where he lives.  40 Past Medical History:  Diagnosis Date  . MRSA (methicillin resistant staph aureus) culture positive     Patient Active Problem List   Diagnosis Date Noted  . Right foot pain 02/22/2015    Past Surgical History:  Procedure Laterality Date  . DEBRIDEMENT LEG     for MRSA       Home Medications    Prior to Admission medications   Medication Sig Start Date End Date Taking? Authorizing Provider  albuterol (PROVENTIL HFA;VENTOLIN HFA) 108 (90 Base) MCG/ACT inhaler Inhale 2 puffs into the lungs every 4 (four) hours as needed for wheezing or shortness of breath (cough, shortness of breath or wheezing.). 01/29/19   Elvina SidleLauenstein, Kurt, MD  azithromycin (ZITHROMAX) 250 MG tablet Take 2 tabs PO x 1 dose, then 1 tab PO QD x 4 days 01/29/19   Elvina SidleLauenstein, Kurt, MD  HYDROcodone-homatropine (HYDROMET) 5-1.5 MG/5ML syrup Take 5 mLs by mouth every 6 (six) hours as needed for cough. 01/29/19   Elvina SidleLauenstein, Kurt, MD  predniSONE (DELTASONE) 20 MG tablet Take 3 PO QAM x2days, 2 PO QAM x2days, 1 PO QAM x2days 03/08/19   Collene Gobbleaub, Chenel Wernli A, MD  Spacer/Aero-Holding Deretha Emoryhambers DEVI 1 Device by Does not apply route every 6 (six) hours as needed. 01/29/19   Elvina SidleLauenstein, Kurt, MD    Family History Family History  Problem Relation Age of Onset  . Diabetes Father   . Diabetes Paternal Uncle   . Healthy Mother      Social History Social History   Tobacco Use  . Smoking status: Never Smoker  . Smokeless tobacco: Never Used  Substance Use Topics  . Alcohol use: No    Alcohol/week: 0.0 standard drinks  . Drug use: No     Allergies   Patient has no known allergies.   Review of Systems Review of Systems  Constitutional: Negative.   HENT: Negative.   Respiratory: Negative.   Skin: Positive for rash.  Hematological: Negative.      Physical Exam Triage Vital Signs ED Triage Vitals [03/08/19 0829]  Enc Vitals Group     BP 138/87     Pulse Rate 61     Resp 20     Temp 97.8 F (36.6 C)     Temp Source Oral     SpO2 97 %     Weight (!) 368 lb (166.9 kg)     Height 6\' 2"  (1.88 m)     Head Circumference      Peak Flow      Pain Score 0     Pain Loc      Pain Edu?      Excl. in GC?    No data found.  Updated Vital Signs BP 138/87 (BP Location: Right Arm)  Pulse 61   Temp 97.8 F (36.6 C) (Oral)   Resp 20   Ht 6\' 2"  (1.88 m)   Wt (!) 166.9 kg   SpO2 97%   BMI 47.25 kg/m   Visual Acuity Right Eye Distance:   Left Eye Distance:   Bilateral Distance:    Right Eye Near:   Left Eye Near:    Bilateral Near:     Physical Exam Constitutional:      Appearance: Normal appearance.  HENT:     Head: Normocephalic.  Pulmonary:     Effort: Pulmonary effort is normal.  Skin:    Comments: There is a maculopapular rash involving the left antecubital fossa.  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  Neurological:     Mental Status: He is alert.      UC Treatments / Results  Labs (all labs ordered are listed, but only abnormal results are displayed) Labs Reviewed  POCT FASTING [facility] MANUAL ENTRY    EKG None  Radiology No results found.  Procedures Procedures (including critical care time)  Medications Ordered in UC Medications   methylPREDNISolone sodium succinate (SOLU-MEDROL) 40 mg/mL injection 40 mg (40 mg Intramuscular Given 03/08/19 0840)    Initial Impression / Assessment and Plan / UC Course  I have reviewed the triage vital signs and the nursing notes.  Pertinent labs & imaging results that were available during my care of the patient were reviewed by me and considered in my medical decision making (see chart for details). Blood sugar checked was 93.  This was done because of his morbid obesity.  He does have recurrent contact dermatitis.  He is going to spray the backyard and get rid of the poison ivy he will be given 40 of Solu-Medrol IM and taper dose of prednisone over 6 days.    Final Clinical Impressions(s) / UC Diagnoses   Final diagnoses:  Allergic contact dermatitis due to plants, except food     Discharge Instructions     Take Claritin or Zyrtec 1 a day. Apply 1% hydrocortisone cream twice a day. Take prednisone taper as instructed. Please try and establish with a primary care physician The random blood sugar we checked was 93 which was normal Please try and remove the poison ivy plants at your house.    ED Prescriptions    Medication Sig Dispense Auth. Provider   predniSONE (DELTASONE) 20 MG tablet Take 3 PO QAM x2days, 2 PO QAM x2days, 1 PO QAM x2days 12 tablet Collene Gobble, MD     Controlled Substance Prescriptions  Controlled Substance Registry consulted? Not Applicable   Collene Gobble, MD 03/08/19 [phone]

## 2019-03-08 NOTE — ED Triage Notes (Signed)
Pt states that he is highly allergic to poison ivy, and has had current rash for about 3 weeks.  Did an e-visit, and prescribed prednisone, and started to go away, but once finished with script, came back.

## 2019-03-08 NOTE — Discharge Instructions (Addendum)
Take Claritin or Zyrtec 1 a day. Apply 1% hydrocortisone cream twice a day. Take prednisone taper as instructed. Please try and establish with a primary care physician The random blood sugar we checked was 93 which was normal Please try and remove the poison ivy plants at your house.

## 2019-10-11 ENCOUNTER — Emergency Department (INDEPENDENT_AMBULATORY_CARE_PROVIDER_SITE_OTHER)
Admission: EM | Admit: 2019-10-11 | Discharge: 2019-10-11 | Disposition: A | Discharge status: Home / Self Care | Payer: BC Managed Care – PPO | Source: Home / Self Care | Attending: Family Medicine | Admitting: Family Medicine

## 2019-10-11 ENCOUNTER — Other Ambulatory Visit: Payer: Self-pay

## 2019-10-11 DIAGNOSIS — H65191 Other acute nonsuppurative otitis media, right ear: Secondary | ICD-10-CM | POA: Diagnosis not present

## 2019-10-11 MED ORDER — AMOXICILLIN 875 MG PO TABS: 875.0000 mg | ORAL_TABLET | Freq: Two times a day (BID) | ORAL | 0 refills | Status: DC

## 2019-10-11 MED ORDER — PREDNISONE 20 MG PO TABS: ORAL_TABLET | ORAL | 0 refills | Status: DC

## 2019-10-11 NOTE — ED Triage Notes (Signed)
Pt tested positive for COVID almost 2 weeks ago.  Now has right ear pain.  Going on for 2 days.

## 2019-10-11 NOTE — ED Provider Notes (Signed)
[facility]    CSN: [id number] Arrival date & time: 10/11/19  1047      History   Chief Complaint Chief Complaint  Patient presents with  . Otalgia    right    HPI Edward Parsons is a 28 y.o. male.   Patient complains of right earache for two days.  He tested positive for COVID19 about two weeks ago and now feels well otherwise.  The history is provided by the patient.    Past Medical History:  Diagnosis Date  . MRSA (methicillin resistant staph aureus) culture positive     Patient Active Problem List   Diagnosis Date Noted  . Right foot pain 02/22/2015    Past Surgical History:  Procedure Laterality Date  . DEBRIDEMENT LEG     for MRSA       Home Medications    Prior to Admission medications   Medication Sig Start Date End Date Taking? Authorizing Provider  albuterol (PROVENTIL HFA;VENTOLIN HFA) 108 (90 Base) MCG/ACT inhaler Inhale 2 puffs into the lungs every 4 (four) hours as needed for wheezing or shortness of breath (cough, shortness of breath or wheezing.). 01/29/19   Robyn Haber, MD  amoxicillin (AMOXIL) 875 MG tablet Take 1 tablet (875 mg total) by mouth 2 (two) times daily. 10/11/19   Kandra Nicolas, MD  azithromycin (ZITHROMAX) 250 MG tablet Take 2 tabs PO x 1 dose, then 1 tab PO QD x 4 days 01/29/19   Robyn Haber, MD  HYDROcodone-homatropine (HYDROMET) 5-1.5 MG/5ML syrup Take 5 mLs by mouth every 6 (six) hours as needed for cough. 01/29/19   Robyn Haber, MD  predniSONE (DELTASONE) 20 MG tablet Take one tab by mouth twice daily for 4 days, then one daily for 3 days. Take with food. 10/11/19   Kandra Nicolas, MD  Spacer/Aero-Holding Josiah Lobo DEVI 1 Device by Does not apply route every 6 (six) hours as needed. 01/29/19   Robyn Haber, MD    Family History Family History  Problem Relation Age of Onset  . Diabetes Father   . Diabetes Paternal Uncle   . Healthy Mother     Social History Social History   Tobacco Use   . Smoking status: Never Smoker  . Smokeless tobacco: Never Used  Substance Use Topics  . Alcohol use: No    Alcohol/week: 0.0 standard drinks  . Drug use: No     Allergies   Patient has no known allergies.   Review of Systems Review of Systems No sore throat No cough No pleuritic pain No wheezing No nasal congestion No post-nasal drainage No sinus pain/pressure No itchy/red eyes + right earache No hemoptysis No SOB No fever/chills No nausea No vomiting No abdominal pain No diarrhea No urinary symptoms No skin rash No fatigue No myalgias No headache   Physical Exam Triage Vital Signs ED Triage Vitals  Enc Vitals Group     BP 10/11/19 1242 (!) 134/91     Pulse Rate 10/11/19 1242 61     Resp 10/11/19 1242 20     Temp 10/11/19 1242 98.4 F (36.9 C)     Temp Source 10/11/19 1242 Oral     SpO2 10/11/19 1242 97 %     Weight 10/11/19 1239 (!) 370 lb (167.8 kg)     Height 10/11/19 1239 6\' 2"  (1.88 m)     Head Circumference --      Peak Flow --      Pain Score 10/11/19 1239 4  Pain Loc --      Pain Edu? --      Excl. in GC? --    No data found.  Updated Vital Signs BP (!) 134/91 (BP Location: Left Arm)   Pulse 61   Temp 98.4 F (36.9 C) (Oral)   Resp 20   Ht 6\' 2"  (1.88 m)   Wt (!) 167.8 kg   SpO2 97%   BMI 47.51 kg/m   Visual Acuity Right Eye Distance:   Left Eye Distance:   Bilateral Distance:    Right Eye Near:   Left Eye Near:    Bilateral Near:     Physical Exam Nursing notes and Vital Signs reviewed. Appearance:  Patient appears stated age, and in no acute distress Eyes:  Pupils are equal, round, and reactive to light and accomodation.  Extraocular movement is intact.  Conjunctivae are not inflamed  Ears:  Canals normal.  Left tympanic membrane normal.  Right tympanic membrane retracted with poor landmarks.  Nose:  Mildly congested turbinates.  No sinus tenderness.  Pharynx:  Normal Neck:  Supple.  No adenopathy  Lungs:  Clear to  auscultation.  Breath sounds are equal.  Moving air well. Heart:  Regular rate and rhythm without murmurs, rubs, or gallops.  Abdomen:  Nontender without masses or hepatosplenomegaly.  Bowel sounds are present.  No CVA or flank tenderness.  Extremities:  No edema.  Skin:  No rash present.   UC Treatments / Results  Labs (all labs ordered are listed, but only abnormal results are displayed) Labs Reviewed -  Tympanometry:  Right ear tympanogram wide; Left ear tympanogram normal.  EKG   Radiology No results found.  Procedures Procedures (including critical care time)  Medications Ordered in UC Medications - No data to display  Initial Impression / Assessment and Plan / UC Course  I have reviewed the triage vital signs and the nursing notes.  Pertinent labs & imaging results that were available during my care of the patient were reviewed by me and considered in my medical decision making (see chart for details).    Begin amoxicillin and prednisone burst/taper. Followup with ENT if not resolved 10 days.   Final Clinical Impressions(s) / UC Diagnoses   Final diagnoses:  Other acute nonsuppurative otitis media of right ear, recurrence not specified     Discharge Instructions     May take Pseudoephedrine (30mg , one or two every 4 to 6 hours) for sinus congestion.     May use Afrin nasal spray (or generic oxymetazoline) each morning for about 5 days and then discontinue.  Also recommend using saline nasal spray several times daily and saline nasal irrigation (AYR is a common brand).  Use Flonase nasal spray each morning after using Afrin nasal spray and saline nasal irrigation.      ED Prescriptions    Medication Sig Dispense Auth. Provider   amoxicillin (AMOXIL) 875 MG tablet Take 1 tablet (875 mg total) by mouth 2 (two) times daily. 20 tablet , MD   predniSONE (DELTASONE) 20 MG tablet Take one tab by mouth twice daily for 4 days, then one daily for 3 days.  Take with food. 11 tablet , MD        Lattie Haw, MD 10/14/19 1131

## 2019-10-11 NOTE — Discharge Instructions (Addendum)
May take Pseudoephedrine (30mg, one or two every 4 to 6 hours) for sinus congestion.    May use Afrin nasal spray (or generic oxymetazoline) each morning for about 5 days and then discontinue.  Also recommend using saline nasal spray several times daily and saline nasal irrigation (AYR is a common brand).  Use Flonase nasal spray each morning after using Afrin nasal spray and saline nasal irrigation.   

## 2020-03-14 ENCOUNTER — Other Ambulatory Visit: Payer: Self-pay

## 2020-03-14 ENCOUNTER — Emergency Department (INDEPENDENT_AMBULATORY_CARE_PROVIDER_SITE_OTHER)
Admission: EM | Admit: 2020-03-14 | Discharge: 2020-03-14 | Disposition: A | Discharge status: Home / Self Care | Payer: BC Managed Care – PPO | Source: Home / Self Care | Attending: Family Medicine | Admitting: Family Medicine

## 2020-03-14 DIAGNOSIS — L255 Unspecified contact dermatitis due to plants, except food: Secondary | ICD-10-CM

## 2020-03-14 MED ORDER — METHYLPREDNISOLONE SODIUM SUCC 125 MG IJ SOLR
80.0000 mg | Freq: Once | INTRAMUSCULAR | Status: AC
  Administered 2020-03-14: 80 mg via INTRAMUSCULAR

## 2020-03-14 MED ORDER — PREDNISONE 20 MG PO TABS: ORAL_TABLET | ORAL | 0 refills | Status: DC

## 2020-03-14 NOTE — ED Provider Notes (Signed)
[name]    CSN: [id number] Arrival date & time: 03/14/20  1306      History   Chief Complaint Chief Complaint  Patient presents with  . Poison Ivy    HPI Edward Parsons is a 29 y.o. male.   Patient was working outside yesterday and believes that he contacted poison ivy.  He has developed a pruritic rash beneath his right eye with mild swelling.   Poison Karlene Einstein This is a new problem. The current episode started 3 to 5 hours ago. The problem occurs constantly. The problem has been rapidly worsening. Nothing aggravates the symptoms. Nothing relieves the symptoms.    Past Medical History:  Diagnosis Date  . MRSA (methicillin resistant staph aureus) culture positive     Patient Active Problem List   Diagnosis Date Noted  . Right foot pain 02/22/2015    Past Surgical History:  Procedure Laterality Date  . DEBRIDEMENT LEG     for MRSA       Home Medications    Prior to Admission medications   Medication Sig Start Date End Date Taking? Authorizing Provider  albuterol (PROVENTIL HFA;VENTOLIN HFA) 108 (90 Base) MCG/ACT inhaler Inhale 2 puffs into the lungs every 4 (four) hours as needed for wheezing or shortness of breath (cough, shortness of breath or wheezing.). 01/29/19   Robyn Haber, MD  amoxicillin (AMOXIL) 875 MG tablet Take 1 tablet (875 mg total) by mouth 2 (two) times daily. 10/11/19   Kandra Nicolas, MD  azithromycin (ZITHROMAX) 250 MG tablet Take 2 tabs PO x 1 dose, then 1 tab PO QD x 4 days 01/29/19   Robyn Haber, MD  HYDROcodone-homatropine (HYDROMET) 5-1.5 MG/5ML syrup Take 5 mLs by mouth every 6 (six) hours as needed for cough. 01/29/19   Robyn Haber, MD  predniSONE (DELTASONE) 20 MG tablet Take one tab by mouth twice daily for 4 days, then one daily for 3 days. Take with food. 03/14/20   Kandra Nicolas, MD  Spacer/Aero-Holding Josiah Lobo DEVI 1 Device by Does not apply route every 6 (six) hours as needed. 01/29/19   Robyn Haber, MD     Family History Family History  Problem Relation Age of Onset  . Diabetes Father   . Diabetes Paternal Uncle   . Healthy Mother     Social History Social History   Tobacco Use  . Smoking status: Never Smoker  . Smokeless tobacco: Never Used  Substance Use Topics  . Alcohol use: Yes    Alcohol/week: 0.0 standard drinks    Comment: occ  . Drug use: No     Allergies   Patient has no known allergies.   Review of Systems Review of Systems  Constitutional: Negative for chills, diaphoresis and fever.  Skin: Positive for color change and rash.  All other systems reviewed and are negative.    Physical Exam Triage Vital Signs ED Triage Vitals  Enc Vitals Group     BP 03/14/20 1318 135/87     Pulse Rate 03/14/20 1318 78     Resp 03/14/20 1318 20     Temp 03/14/20 1318 98.8 F (37.1 C)     Temp Source 03/14/20 1318 Oral     SpO2 03/14/20 1318 97 %     Weight --      Height --      Head Circumference --      Peak Flow --      Pain Score 03/14/20 1317 1  Pain Loc --      Pain Edu? --      Excl. in GC? --    No data found.  Updated Vital Signs BP 135/87 (BP Location: Left Arm)   Pulse 78   Temp 98.8 F (37.1 C) (Oral)   Resp 20   SpO2 97%   Visual Acuity Right Eye Distance:   Left Eye Distance:   Bilateral Distance:    Right Eye Near:   Left Eye Near:    Bilateral Near:     Physical Exam Vitals and nursing note reviewed.  Constitutional:      General: He is not in acute distress.    Appearance: He is obese.  HENT:     Head: Normocephalic.      Comments: Beneath the right eye there is mild swelling and erythema without tenderness to palpation.    Right Ear: External ear normal.     Left Ear: External ear normal.     Nose: Nose normal.     Mouth/Throat:     Pharynx: Oropharynx is clear.  Eyes:     Extraocular Movements: Extraocular movements intact.     Conjunctiva/sclera: Conjunctivae normal.     Pupils: Pupils are equal, round, and  reactive to light.  Cardiovascular:     Rate and Rhythm: Normal rate.  Pulmonary:     Effort: Pulmonary effort is normal.  Lymphadenopathy:     Cervical: No cervical adenopathy.  Skin:    General: Skin is warm and dry.  Neurological:     Mental Status: He is alert.      UC Treatments / Results  Labs (all labs ordered are listed, but only abnormal results are displayed) Labs Reviewed - No data to display  EKG   Radiology No results found.  Procedures Procedures (including critical care time)  Medications Ordered in UC Medications  methylPREDNISolone sodium succinate (SOLU-MEDROL) 125 mg/2 mL injection 80 mg (has no administration in time range)    Initial Impression / Assessment and Plan / UC Course  I have reviewed the triage vital signs and the nursing notes.  Pertinent labs & imaging results that were available during my care of the patient were reviewed by me and considered in my medical decision making (see chart for details).    Administered Solumedrol 80mg  IM, then begin prednisone burst/taper. Followup with Family Doctor if not improved in one week.    Final Clinical Impressions(s) / UC Diagnoses   Final diagnoses:  Rhus dermatitis     Discharge Instructions     Begin prednisone Wednesday 03/15/20. May take Zyrtec, Claritin, or Allegra for itching.  Apply cool compress for itching.    ED Prescriptions    Medication Sig Dispense Auth. Provider   predniSONE (DELTASONE) 20 MG tablet Take one tab by mouth twice daily for 4 days, then one daily for 3 days. Take with food. 11 tablet 05/15/20, MD        Lattie Haw, MD 03/15/20 [phone]

## 2020-03-14 NOTE — ED Triage Notes (Signed)
Patient presents to Urgent Care with complaints of itchy area under his right eye since earlier today. Patient reports he thinks he is having an allergic reaction to poison ivy.  Pt has not tried anything otc, states they have never worked in the past for his poison ivy.

## 2020-03-14 NOTE — Discharge Instructions (Addendum)
Begin prednisone Wednesday 03/15/20. May take Zyrtec, Claritin, or Allegra for itching.  Apply cool compress for itching.

## 2020-04-10 ENCOUNTER — Encounter: Payer: Self-pay | Admitting: Emergency Medicine

## 2020-04-10 ENCOUNTER — Other Ambulatory Visit: Payer: Self-pay

## 2020-04-10 ENCOUNTER — Emergency Department (INDEPENDENT_AMBULATORY_CARE_PROVIDER_SITE_OTHER)
Admission: EM | Admit: 2020-04-10 | Discharge: 2020-04-10 | Disposition: A | Discharge status: Home / Self Care | Payer: BC Managed Care – PPO | Source: Home / Self Care | Attending: Family Medicine | Admitting: Family Medicine

## 2020-04-10 DIAGNOSIS — J069 Acute upper respiratory infection, unspecified: Secondary | ICD-10-CM

## 2020-04-10 HISTORY — DX: Morbid (severe) obesity due to excess calories: E66.01

## 2020-04-10 MED ORDER — AMOXICILLIN 875 MG PO TABS: 875.0000 mg | ORAL_TABLET | Freq: Two times a day (BID) | ORAL | 0 refills | Status: DC

## 2020-04-10 NOTE — ED Triage Notes (Signed)
Reports 3 days of congestion and headache; history of one or two sinus infections per year and feels this may what is currently experiencing. Took Mucinex today. Has not had covid vaccination and refuses same.

## 2020-04-10 NOTE — Discharge Instructions (Addendum)
Take plain guaifenesin (1200mg  extended release tabs such as Mucinex) twice daily, with plenty of water, for cough and congestion.  May add Pseudoephedrine (30mg , one or two every 4 to 6 hours) for sinus congestion.  Get adequate rest.   May use Afrin nasal spray (or generic oxymetazoline) each morning for about 5 days and then discontinue.  Also recommend using saline nasal spray several times daily and saline nasal irrigation (AYR is a common brand).  Use Flonase nasal spray each morning after using Afrin nasal spray and saline nasal irrigation. Try warm salt water gargles for sore throat.  Stop all antihistamines for now, and other non-prescription cough/cold preparations. May take Ibuprofen 200mg , 4 tabs every 8 hours with food for headache, body aches, etc. May take Delsym Cough Suppressant at bedtime for nighttime cough.

## 2020-04-10 NOTE — ED Provider Notes (Signed)
[name]    CSN: [id number] Arrival date & time: 04/10/20  1042      History   Chief Complaint Chief Complaint  Patient presents with  . Nasal Congestion  . Headache    HPI Edward Parsons is a 29 y.o. male.   Patient complains of onset of sore throat, sinus congestion, facial pressure, and mild fatigue.  He has a history of recurrent sinusitis several times per year.  The history is provided by the patient.    Past Medical History:  Diagnosis Date  . Morbid obesity ([place])   . MRSA (methicillin resistant staph aureus) culture positive     Patient Active Problem List   Diagnosis Date Noted  . Right foot pain 02/22/2015    Past Surgical History:  Procedure Laterality Date  . DEBRIDEMENT LEG     for MRSA       Home Medications    Prior to Admission medications   Medication Sig Start Date End Date Taking? Authorizing Provider  albuterol (PROVENTIL HFA;VENTOLIN HFA) 108 (90 Base) MCG/ACT inhaler Inhale 2 puffs into the lungs every 4 (four) hours as needed for wheezing or shortness of breath (cough, shortness of breath or wheezing.). 01/29/19   Robyn Haber, MD  amoxicillin (AMOXIL) 875 MG tablet Take 1 tablet (875 mg total) by mouth 2 (two) times daily. 04/10/20   Kandra Nicolas, MD  Spacer/Aero-Holding Josiah Lobo DEVI 1 Device by Does not apply route every 6 (six) hours as needed. 01/29/19   Robyn Haber, MD    Family History Family History  Problem Relation Age of Onset  . Diabetes Father   . Diabetes Paternal Uncle   . Healthy Mother     Social History Social History   Tobacco Use  . Smoking status: Never Smoker  . Smokeless tobacco: Never Used  Substance Use Topics  . Alcohol use: Yes    Alcohol/week: 0.0 standard drinks    Comment: occ  . Drug use: No     Allergies   Patient has no known allergies.   Review of Systems Review of Systems + sore throat No cough No pleuritic pain No wheezing + nasal congestion +  post-nasal drainage + sinus pain/pressure No itchy/red eyes No earache No hemoptysis No SOB No fever/chills No nausea No vomiting No abdominal pain No diarrhea No urinary symptoms No skin rash + fatigue No myalgias No headache Used OTC meds (Mucinex) without relief   Physical Exam Triage Vital Signs ED Triage Vitals  Enc Vitals Group     BP 04/10/20 1126 (!) 177/76     Pulse Rate 04/10/20 1126 79     Resp 04/10/20 1126 18     Temp 04/10/20 1126 98.4 F (36.9 C)     Temp Source 04/10/20 1126 Oral     SpO2 04/10/20 1126 96 %     Weight 04/10/20 1128 (!) 380 lb (172.4 kg)     Height 04/10/20 1128 6\' 2"  (1.88 m)     Head Circumference --      Peak Flow --      Pain Score 04/10/20 1127 4     Pain Loc --      Pain Edu? --      Excl. in [place]? --    No data found.  Updated Vital Signs BP (!) 177/76 (BP Location: Right Wrist)   Pulse 79   Temp 98.4 F (36.9 C) (Oral)   Resp 18   Ht 6\' 2"  (1.88 m)  Wt (!) 172.4 kg   SpO2 96%   BMI 48.79 kg/m   Visual Acuity Right Eye Distance:   Left Eye Distance:   Bilateral Distance:    Right Eye Near:   Left Eye Near:    Bilateral Near:     Physical Exam Nursing notes and Vital Signs reviewed. Appearance:  Patient appears stated age, and in no acute distress Eyes:  Pupils are equal, round, and reactive to light and accomodation.  Extraocular movement is intact.  Conjunctivae are not inflamed  Ears:  Canals normal.  Tympanic membranes normal.  Nose:  Congested turbinates.  Maxillary sinus tenderness is present.  Pharynx:  Normal Neck:  Supple.  Mildly enlarged lateral nodes are present, tender to palpation on the left.   Lungs:  Clear to auscultation.  Breath sounds are equal.  Moving air well. Heart:  Regular rate and rhythm without murmurs, rubs, or gallops.  Abdomen:  Nontender without masses or hepatosplenomegaly.  Bowel sounds are present.  No CVA or flank tenderness.  Extremities:  No edema.  Skin:  No rash  present.   UC Treatments / Results  Labs (all labs ordered are listed, but only abnormal results are displayed) Labs Reviewed - No data to display  EKG   Radiology No results found.  Procedures Procedures (including critical care time)  Medications Ordered in UC Medications - No data to display  Initial Impression / Assessment and Plan / UC Course  I have reviewed the triage vital signs and the nursing notes.  Pertinent labs & imaging results that were available during my care of the patient were reviewed by me and considered in my medical decision making (see chart for details).    Significant history of sinusitis; begin amoxicillin 875mg  BID. Followup with Family Doctor if not improved in 10 days.   Final Clinical Impressions(s) / UC Diagnoses   Final diagnoses:  Viral URI     Discharge Instructions     Take plain guaifenesin (1200mg  extended release tabs such as Mucinex) twice daily, with plenty of water, for cough and congestion.  May add Pseudoephedrine (30mg , one or two every 4 to 6 hours) for sinus congestion.  Get adequate rest.   May use Afrin nasal spray (or generic oxymetazoline) each morning for about 5 days and then discontinue.  Also recommend using saline nasal spray several times daily and saline nasal irrigation (AYR is a common brand).  Use Flonase nasal spray each morning after using Afrin nasal spray and saline nasal irrigation. Try warm salt water gargles for sore throat.  Stop all antihistamines for now, and other non-prescription cough/cold preparations. May take Ibuprofen 200mg , 4 tabs every 8 hours with food for headache, body aches, etc. May take Delsym Cough Suppressant at bedtime for nighttime cough.     ED Prescriptions    Medication Sig Dispense Auth. Provider   amoxicillin (AMOXIL) 875 MG tablet Take 1 tablet (875 mg total) by mouth 2 (two) times daily. 20 tablet , MD        , MD 04/14/20 1210

## 2020-06-06 ENCOUNTER — Emergency Department (INDEPENDENT_AMBULATORY_CARE_PROVIDER_SITE_OTHER)
Admission: EM | Admit: 2020-06-06 | Discharge: 2020-06-06 | Disposition: A | Discharge status: Home / Self Care | Payer: BC Managed Care – PPO | Source: Home / Self Care | Attending: Family Medicine | Admitting: Family Medicine

## 2020-06-06 ENCOUNTER — Other Ambulatory Visit: Payer: Self-pay

## 2020-06-06 DIAGNOSIS — J069 Acute upper respiratory infection, unspecified: Secondary | ICD-10-CM | POA: Diagnosis not present

## 2020-06-06 MED ORDER — AZITHROMYCIN 250 MG PO TABS: ORAL_TABLET | ORAL | 0 refills | Status: DC

## 2020-06-06 MED ORDER — PREDNISONE 20 MG PO TABS: ORAL_TABLET | ORAL | 0 refills | Status: DC

## 2020-06-06 NOTE — ED Provider Notes (Signed)
[name]    CSN: [id number] Arrival date & time: 06/06/20  1023      History   Chief Complaint Chief Complaint  Patient presents with  . Facial Pain    Sinus issues  . Cough    HPI Edward Parsons is a 29 y.o. male.   Patient developed loose stools and sinus congestion yesterday.  He now has a cough and fatigue but feels well otherwise.   He denies chest tightness, shortness of breath, and changes in taste/smell.  He notes that he has had improvement in the past when he uses and albuterol MDI during URI's.    The history is provided by the patient.    Past Medical History:  Diagnosis Date  . Morbid obesity ([facility])   . MRSA (methicillin resistant staph aureus) culture positive     Patient Active Problem List   Diagnosis Date Noted  . Right foot pain 02/22/2015    Past Surgical History:  Procedure Laterality Date  . DEBRIDEMENT LEG     for MRSA       Home Medications    Prior to Admission medications   Medication Sig Start Date End Date Taking? Authorizing Provider  albuterol (PROVENTIL HFA;VENTOLIN HFA) 108 (90 Base) MCG/ACT inhaler Inhale 2 puffs into the lungs every 4 (four) hours as needed for wheezing or shortness of breath (cough, shortness of breath or wheezing.). 01/29/19   Elvina Sidle, MD  azithromycin (ZITHROMAX Z-PAK) 250 MG tablet Take 2 tabs today; then begin one tab once daily for 4 more days. 06/06/20   Lattie Haw, MD  predniSONE (DELTASONE) 20 MG tablet Take one tab by mouth twice daily for 4 days, then one daily for 3 days. Take with food. 06/06/20   Lattie Haw, MD  Spacer/Aero-Holding Deretha Emory DEVI 1 Device by Does not apply route every 6 (six) hours as needed. 01/29/19   Elvina Sidle, MD    Family History Family History  Problem Relation Age of Onset  . Diabetes Father   . Diabetes Paternal Uncle   . Healthy Mother     Social History Social History   Tobacco Use  . Smoking status: Never Smoker  .  Smokeless tobacco: Never Used  Vaping Use  . Vaping Use: Never used  Substance Use Topics  . Alcohol use: Yes    Alcohol/week: 0.0 standard drinks    Comment: occ  . Drug use: No     Allergies   Patient has no known allergies.   Review of Systems Review of Systems No sore throat + cough No pleuritic pain No wheezing + nasal congestion + post-nasal drainage No sinus pain/pressure No itchy/red eyes No earache No hemoptysis No SOB No fever/chills No nausea No vomiting No abdominal pain + diarrhea, resolved No urinary symptoms No skin rash + fatigue No myalgias No headache   Physical Exam Triage Vital Signs ED Triage Vitals  Enc Vitals Group     BP 06/06/20 1030 (!) 144/98     Pulse Rate 06/06/20 1030 63     Resp 06/06/20 1030 18     Temp 06/06/20 1030 97.7 F (36.5 C)     Temp Source 06/06/20 1030 Oral     SpO2 06/06/20 1030 97 %     Weight --      Height --      Head Circumference --      Peak Flow --      Pain Score 06/06/20 1031 0  Pain Loc --      Pain Edu? --      Excl. in [place]? --    No data found.  Updated Vital Signs BP (!) 144/98 (BP Location: Left Wrist)   Pulse 63   Temp 97.7 F (36.5 C) (Oral)   Resp 18   SpO2 97%   Visual Acuity Right Eye Distance:   Left Eye Distance:   Bilateral Distance:    Right Eye Near:   Left Eye Near:    Bilateral Near:     Physical Exam Nursing notes and Vital Signs reviewed. Appearance:  Patient appears stated age, and in no acute distress Eyes:  Pupils are equal, round, and reactive to light and accomodation.  Extraocular movement is intact.  Conjunctivae are not inflamed  Ears:  Canals normal.  Tympanic membranes normal.  Nose:  Mildly congested turbinates.  No sinus tenderness.   Pharynx:  Normal Neck:  Supple.  Enlarged lateral nodes are present, tender to palpation on the left.   Lungs:  Clear to auscultation.  Breath sounds are equal.  Moving air well. Heart:  Regular rate and rhythm  without murmurs, rubs, or gallops.  Abdomen:  Nontender without masses or hepatosplenomegaly.  Bowel sounds are present.  No CVA or flank tenderness.  Extremities:  No edema.  Skin:  No rash present.   UC Treatments / Results  Labs (all labs ordered are listed, but only abnormal results are displayed) Labs Reviewed - No data to display  EKG   Radiology No results found.  Procedures Procedures (including critical care time)  Medications Ordered in UC Medications - No data to display  Initial Impression / Assessment and Plan / UC Course  I have reviewed the triage vital signs and the nursing notes.  Pertinent labs & imaging results that were available during my care of the patient were reviewed by me and considered in my medical decision making (see chart for details).    There is no evidence of bacterial infection today.  Treat symptomatically for now. Begin prednisone burst/taper. Followup with Family Doctor if not improved in about 10 days.   Final Clinical Impressions(s) / UC Diagnoses   Final diagnoses:  Viral URI with cough     Discharge Instructions     Take plain guaifenesin (1200mg  extended release tabs such as Mucinex) twice daily, with plenty of water, for cough and congestion.  May add Pseudoephedrine (30mg , one or two every 4 to 6 hours) for sinus congestion.  Get adequate rest.   May use Afrin nasal spray (or generic oxymetazoline) each morning for about 5 days and then discontinue.  Also recommend using saline nasal spray several times daily and saline nasal irrigation (AYR is a common brand).  Use Flonase nasal spray each morning after using Afrin nasal spray and saline nasal irrigation. Try warm salt water gargles for sore throat.  Stop all antihistamines for now, and other non-prescription cough/cold preparations. May take Tylenol as needed for fever, sore throat, etc. May take Delsym Cough Suppressant ("12 Hour Cough Relief") at bedtime for nighttime  cough.  Begin Azithromycin if not improving about one week or if persistent fever develops  (Given a prescription to hold, with an expiration date)      ED Prescriptions    Medication Sig Dispense Auth. Provider   predniSONE (DELTASONE) 20 MG tablet Take one tab by mouth twice daily for 4 days, then one daily for 3 days. Take with food. 11 tablet ,  MD   azithromycin (ZITHROMAX Z-PAK) 250 MG tablet Take 2 tabs today; then begin one tab once daily for 4 more days. 6 tablet Lattie Haw, MD        Lattie Haw, MD 06/10/20 1155

## 2020-06-06 NOTE — ED Triage Notes (Signed)
Pt c/o sinus drainage/post nasal drip x 2 day. Says its causing him a cough. Expresses that his breathing feels "raspy". No OTC meds tried. Has not had covid vaccinations.

## 2020-06-06 NOTE — Discharge Instructions (Addendum)
Take plain guaifenesin (1200mg  extended release tabs such as Mucinex) twice daily, with plenty of water, for cough and congestion.  May add Pseudoephedrine (30mg , one or two every 4 to 6 hours) for sinus congestion.  Get adequate rest.   May use Afrin nasal spray (or generic oxymetazoline) each morning for about 5 days and then discontinue.  Also recommend using saline nasal spray several times daily and saline nasal irrigation (AYR is a common brand).  Use Flonase nasal spray each morning after using Afrin nasal spray and saline nasal irrigation. Try warm salt water gargles for sore throat.  Stop all antihistamines for now, and other non-prescription cough/cold preparations. May take Tylenol as needed for fever, sore throat, etc. May take Delsym Cough Suppressant ("12 Hour Cough Relief") at bedtime for nighttime cough.  Begin Azithromycin if not improving about one week or if persistent fever develops

## 2020-07-08 ENCOUNTER — Other Ambulatory Visit: Payer: Self-pay

## 2020-07-08 ENCOUNTER — Emergency Department (INDEPENDENT_AMBULATORY_CARE_PROVIDER_SITE_OTHER)
Admission: RE | Admit: 2020-07-08 | Discharge: 2020-07-08 | Disposition: A | Discharge status: Home / Self Care | Payer: BC Managed Care – PPO | Source: Ambulatory Visit

## 2020-07-08 VITALS — BP 94/65 | HR 82 | Temp 98.5°F | Ht 73.0 in | Wt 380.0 lb

## 2020-07-08 DIAGNOSIS — L249 Irritant contact dermatitis, unspecified cause: Secondary | ICD-10-CM

## 2020-07-08 DIAGNOSIS — R21 Rash and other nonspecific skin eruption: Secondary | ICD-10-CM | POA: Diagnosis not present

## 2020-07-08 MED ORDER — TRIAMCINOLONE ACETONIDE 0.025 % EX OINT: 1.0000 "application " | TOPICAL_OINTMENT | Freq: Three times a day (TID) | CUTANEOUS | 0 refills | Status: DC

## 2020-07-08 MED ORDER — PREDNISONE 50 MG PO TABS: 50.0000 mg | ORAL_TABLET | Freq: Every day | ORAL | 0 refills | Status: DC

## 2020-07-08 NOTE — ED Provider Notes (Signed)
[name]    CSN: [id number] Arrival date & time: 07/08/20  1212      History   Chief Complaint Chief Complaint  Patient presents with  . Poison Ivy    HPI Edward Parsons is a 29 y.o. male.   HPI  Poison ivy contact during mowing x 1 week ago. He had a rash localized to the left lower forearm. The rash has increased in diameter. In the past, the reaction has responded well to prednisone. He prefers oral prednisone oppose to an injection.   Past Medical History:  Diagnosis Date  . Morbid obesity (HCC)   . MRSA (methicillin resistant staph aureus) culture positive     Patient Active Problem List   Diagnosis Date Noted  . Right foot pain 02/22/2015    Past Surgical History:  Procedure Laterality Date  . DEBRIDEMENT LEG     for MRSA       Home Medications    Prior to Admission medications   Medication Sig Start Date End Date Taking? Authorizing Provider  albuterol (PROVENTIL HFA;VENTOLIN HFA) 108 (90 Base) MCG/ACT inhaler Inhale 2 puffs into the lungs every 4 (four) hours as needed for wheezing or shortness of breath (cough, shortness of breath or wheezing.). 01/29/19   Elvina Sidle, MD  azithromycin (ZITHROMAX Z-PAK) 250 MG tablet Take 2 tabs today; then begin one tab once daily for 4 more days. 06/06/20   Lattie Haw, MD  predniSONE (DELTASONE) 20 MG tablet Take one tab by mouth twice daily for 4 days, then one daily for 3 days. Take with food. 06/06/20   Lattie Haw, MD  Spacer/Aero-Holding Deretha Emory DEVI 1 Device by Does not apply route every 6 (six) hours as needed. 01/29/19   Elvina Sidle, MD    Family History Family History  Problem Relation Age of Onset  . Diabetes Father   . Diabetes Paternal Uncle   . Healthy Mother     Social History Social History   Tobacco Use  . Smoking status: Never Smoker  . Smokeless tobacco: Never Used  Vaping Use  . Vaping Use: Never used  Substance Use Topics  . Alcohol use: Yes     Alcohol/week: 0.0 standard drinks    Comment: occ  . Drug use: No     Allergies   Patient has no known allergies.   Review of Systems Review of Systems Pertinent negatives listed in HPI   Physical Exam Triage Vital Signs ED Triage Vitals  Enc Vitals Group     BP 07/08/20 1245 94/65     Pulse Rate 07/08/20 1245 82     Resp --      Temp 07/08/20 1245 98.5 F (36.9 C)     Temp Source 07/08/20 1245 Oral     SpO2 07/08/20 1245 96 %     Weight 07/08/20 1243 (!) 380 lb (172.4 kg)     Height 07/08/20 1243 6\' 1"  (1.854 m)     Head Circumference --      Peak Flow --      Pain Score --      Pain Loc --      Pain Edu? --      Excl. in GC? --    No data found.  Updated Vital Signs BP 94/65 (BP Location: Left Arm)   Pulse 82   Temp 98.5 F (36.9 C) (Oral)   Ht 6\' 1"  (1.854 m)   Wt (!) 380 lb (172.4 kg)  SpO2 96%   BMI 50.13 kg/m   Visual Acuity Right Eye Distance:   Left Eye Distance:   Bilateral Distance:    Right Eye Near:   Left Eye Near:    Bilateral Near:     Physical Exam General appearance: alert, well developed, well nourished, cooperative and in no distress Head: Normocephalic, without obvious abnormality, atraumatic Respiratory: Respirations even and unlabored, normal respiratory rate Heart: rate and rhythm normal. No gallop or murmurs noted on exam  Extremities: No gross deformities Skin: macular rash right forearm  Psych: Appropriate mood and affect. .   UC Treatments / Results  Labs (all labs ordered are listed, but only abnormal results are displayed) Labs Reviewed - No data to display  EKG   Radiology No results found.  Procedures Procedures (including critical care time)  Medications Ordered in UC Medications - No data to display  Initial Impression / Assessment and Plan / UC Course  I have reviewed the triage vital signs and the nursing notes.  Pertinent labs & imaging results that were available during my care of the patient  were reviewed by me and considered in my medical decision making (see chart for details).    Contact dermatitis. Treatment per orders. An After Visit Summary was printed and given to the patient/family. Precautions discussed. Red flags discussed. Questions invited and answered. They voiced understanding and agreement.  Final Clinical Impressions(s) / UC Diagnoses   Final diagnoses:  Rash and nonspecific skin eruption  Irritant contact dermatitis, unspecified trigger   Discharge Instructions   None    ED Prescriptions    Medication Sig Dispense Auth. Provider   predniSONE (DELTASONE) 50 MG tablet Take 1 tablet (50 mg total) by mouth daily with breakfast. 3 tablet Bing Neighbors, FNP   triamcinolone (KENALOG) 0.025 % ointment Apply 1 application topically 3 (three) times daily. 90 g Bing Neighbors, FNP     PDMP not reviewed this encounter.   Bing Neighbors, FNP 07/12/20 1759

## 2020-07-08 NOTE — ED Triage Notes (Signed)
Pt states that he has some poison ivy on his arm that is starting to spread. Pt states that he was ding some yard work. x1 week.  pt states that he is not vaccinated.

## 2021-04-15 ENCOUNTER — Emergency Department (INDEPENDENT_AMBULATORY_CARE_PROVIDER_SITE_OTHER)
Admission: EM | Admit: 2021-04-15 | Discharge: 2021-04-15 | Disposition: A | Discharge status: Home / Self Care | Payer: Self-pay | Source: Home / Self Care

## 2021-04-15 ENCOUNTER — Other Ambulatory Visit: Payer: Self-pay

## 2021-04-15 DIAGNOSIS — L237 Allergic contact dermatitis due to plants, except food: Secondary | ICD-10-CM

## 2021-04-15 MED ORDER — PREDNISONE 10 MG (21) PO TBPK: ORAL_TABLET | Freq: Every day | ORAL | 0 refills | Status: DC

## 2021-04-15 NOTE — ED Triage Notes (Signed)
Pt states that he has poison ivy all over his body. x2 days

## 2021-04-15 NOTE — ED Provider Notes (Signed)
[facility]    CSN: [id number] Arrival date & time: 04/15/21  1033      History   Chief Complaint Chief Complaint  Patient presents with  . Poison Ivy    Pt states that he has poison ivy all over his body. X2 days    HPI Edward Parsons is a 30 y.o. male.   Patient has 2-day history of rash consistent with rhus dermatitis.  He gets this annually by history 3.  HPI  Past Medical History:  Diagnosis Date  . Morbid obesity (HCC)   . MRSA (methicillin resistant staph aureus) culture positive     Patient Active Problem List   Diagnosis Date Noted  . Right foot pain 02/22/2015    Past Surgical History:  Procedure Laterality Date  . DEBRIDEMENT LEG     for MRSA       Home Medications    Prior to Admission medications   Medication Sig Start Date End Date Taking? Authorizing Provider  Acetaminophen (TYLENOL 8 HOUR PO) Take by mouth.   Yes [provider]  albuterol (PROVENTIL HFA;VENTOLIN HFA) 108 (90 Base) MCG/ACT inhaler Inhale 2 puffs into the lungs every 4 (four) hours as needed for wheezing or shortness of breath (cough, shortness of breath or wheezing.). 01/29/19   Elvina Sidle, MD  azithromycin (ZITHROMAX Z-PAK) 250 MG tablet Take 2 tabs today; then begin one tab once daily for 4 more days. 06/06/20   Lattie Haw, MD  predniSONE (DELTASONE) 50 MG tablet Take 1 tablet (50 mg total) by mouth daily with breakfast. 07/08/20   Bing Neighbors, FNP  Spacer/Aero-Holding Deretha Emory DEVI 1 Device by Does not apply route every 6 (six) hours as needed. 01/29/19   Elvina Sidle, MD  triamcinolone (KENALOG) 0.025 % ointment Apply 1 application topically 3 (three) times daily. 07/08/20   Bing Neighbors, FNP    Family History Family History  Problem Relation Age of Onset  . Diabetes Father   . Diabetes Paternal Uncle   . Healthy Mother     Social History Social History   Tobacco Use  . Smoking status: Never Smoker  . Smokeless tobacco:  Never Used  Vaping Use  . Vaping Use: Never used  Substance Use Topics  . Alcohol use: Yes    Alcohol/week: 0.0 standard drinks    Comment: occ  . Drug use: No     Allergies   Patient has no known allergies.   Review of Systems Review of Systems  Skin: Positive for rash.  All other systems reviewed and are negative.    Physical Exam Triage Vital Signs ED Triage Vitals  Enc Vitals Group     BP 04/15/21 1052 (!) 138/100     Pulse Rate 04/15/21 1052 74     Resp --      Temp 04/15/21 1052 98.3 F (36.8 C)     Temp Source 04/15/21 1052 Oral     SpO2 04/15/21 1052 98 %     Weight 04/15/21 1049 (!) 388 lb (176 kg)     Height 04/15/21 1049 6\' 2"  (1.88 m)     Head Circumference --      Peak Flow --      Pain Score 04/15/21 1049 0     Pain Loc --      Pain Edu? --      Excl. in GC? --    No data found.  Updated Vital Signs BP (!) 138/100 (BP Location: Right  Arm)   Pulse 74   Temp 98.3 F (36.8 C) (Oral)   Ht 6\' 2"  (1.88 m)   Wt (!) 176 kg   SpO2 98%   BMI 49.82 kg/m   Visual Acuity Right Eye Distance:   Left Eye Distance:   Bilateral Distance:    Right Eye Near:   Left Eye Near:    Bilateral Near:     Physical Exam Vitals and nursing note reviewed.  Constitutional:      Appearance: Normal appearance. He is obese.  Cardiovascular:     Rate and Rhythm: Normal rate.  Pulmonary:     Effort: Pulmonary effort is normal.  Skin:    Comments: Rash mostly on extremities at this point consistent with contact dermatitis  Neurological:     Mental Status: He is alert.      UC Treatments / Results  Labs (all labs ordered are listed, but only abnormal results are displayed) Labs Reviewed - No data to display  EKG   Radiology No results found.  Procedures Procedures (including critical care time)  Medications Ordered in UC Medications - No data to display  Initial Impression / Assessment and Plan / UC Course  I have reviewed the triage vital  signs and the nursing notes.  Pertinent labs & imaging results that were available during my care of the patient were reviewed by me and considered in my medical decision making (see chart for details).     Contact dermatitis likely secondary to rhus.  Will treat for 10 days given natural history and 2-day presentation Final Clinical Impressions(s) / UC Diagnoses   Final diagnoses:  None   Discharge Instructions   None    ED Prescriptions    None     PDMP not reviewed this encounter.   , MD 04/15/21 1113

## 2021-06-04 ENCOUNTER — Other Ambulatory Visit: Payer: Self-pay

## 2021-06-04 ENCOUNTER — Emergency Department (INDEPENDENT_AMBULATORY_CARE_PROVIDER_SITE_OTHER): Payer: BLUE CROSS/BLUE SHIELD

## 2021-06-04 ENCOUNTER — Emergency Department (INDEPENDENT_AMBULATORY_CARE_PROVIDER_SITE_OTHER)
Admission: EM | Admit: 2021-06-04 | Discharge: 2021-06-04 | Disposition: A | Discharge status: Home / Self Care | Payer: BLUE CROSS/BLUE SHIELD | Source: Home / Self Care

## 2021-06-04 DIAGNOSIS — S39012A Strain of muscle, fascia and tendon of lower back, initial encounter: Secondary | ICD-10-CM

## 2021-06-04 DIAGNOSIS — M62830 Muscle spasm of back: Secondary | ICD-10-CM

## 2021-06-04 DIAGNOSIS — M545 Low back pain, unspecified: Secondary | ICD-10-CM | POA: Diagnosis not present

## 2021-06-04 MED ORDER — BACLOFEN 10 MG PO TABS: 10.0000 mg | ORAL_TABLET | Freq: Three times a day (TID) | ORAL | 0 refills | Status: DC

## 2021-06-04 MED ORDER — PREDNISONE 20 MG PO TABS: ORAL_TABLET | ORAL | 0 refills | Status: DC

## 2021-06-04 MED ORDER — METHYLPREDNISOLONE ACETATE 80 MG/ML IJ SUSP
80.0000 mg | Freq: Once | INTRAMUSCULAR | Status: AC
  Administered 2021-06-04: 80 mg via INTRAMUSCULAR

## 2021-06-04 NOTE — ED Provider Notes (Signed)
[facility]    CSN: [id number] Arrival date & time: 06/04/21  4818      History   Chief Complaint Chief Complaint  Patient presents with   Back Pain    HPI Edward Parsons is a 30 y.o. male.   HPI 30 year old male presents with acute right-sided low back pain since this morning.  Reports there this morning while at home he bent over to grab belt and felt a pop followed by severe back pain reporting 10 of 10.  Denies previous lower back problems.  Reports taking Motrin earlier this morning.  Past Medical History:  Diagnosis Date   Morbid obesity (HCC)    MRSA (methicillin resistant staph aureus) culture positive     Patient Active Problem List   Diagnosis Date Noted   Right foot pain 02/22/2015    Past Surgical History:  Procedure Laterality Date   DEBRIDEMENT LEG     for MRSA       Home Medications    Prior to Admission medications   Medication Sig Start Date End Date Taking? Authorizing Provider  baclofen (LIORESAL) 10 MG tablet Take 1 tablet (10 mg total) by mouth 3 (three) times daily. 06/04/21  Yes Trevor Iha, FNP  predniSONE (DELTASONE) 20 MG tablet Take 3 tabs PO daily x 5 days. 06/04/21  Yes Trevor Iha, FNP  Acetaminophen (TYLENOL 8 HOUR PO) Take by mouth.    [provider]  albuterol (PROVENTIL HFA;VENTOLIN HFA) 108 (90 Base) MCG/ACT inhaler Inhale 2 puffs into the lungs every 4 (four) hours as needed for wheezing or shortness of breath (cough, shortness of breath or wheezing.). 01/29/19   Elvina Sidle, MD  azithromycin (ZITHROMAX Z-PAK) 250 MG tablet Take 2 tabs today; then begin one tab once daily for 4 more days. 06/06/20   Lattie Haw, MD  Spacer/Aero-Holding Deretha Emory DEVI 1 Device by Does not apply route every 6 (six) hours as needed. 01/29/19   Elvina Sidle, MD  triamcinolone (KENALOG) 0.025 % ointment Apply 1 application topically 3 (three) times daily. 07/08/20   Bing Neighbors, FNP    Family History Family  History  Problem Relation Age of Onset   Diabetes Father    Diabetes Paternal Uncle    Healthy Mother     Social History Social History   Tobacco Use   Smoking status: Never   Smokeless tobacco: Never  Vaping Use   Vaping Use: Never used  Substance Use Topics   Alcohol use: Yes    Alcohol/week: 0.0 standard drinks    Comment: occ   Drug use: No     Allergies   Patient has no known allergies.   Review of Systems Review of Systems  Musculoskeletal:  Positive for back pain.  All other systems reviewed and are negative.   Physical Exam Triage Vital Signs ED Triage Vitals  Enc Vitals Group     BP 06/04/21 0847 (!) 136/100     Pulse Rate 06/04/21 0847 70     Resp 06/04/21 0847 18     Temp 06/04/21 0847 98.2 F (36.8 C)     Temp Source 06/04/21 0847 Oral     SpO2 06/04/21 0847 97 %     Weight --      Height --      Head Circumference --      Peak Flow --      Pain Score 06/04/21 0846 10     Pain Loc --  Pain Edu? --      Excl. in GC? --    No data found.  Updated Vital Signs BP (!) 136/100 (BP Location: Right Arm)   Pulse 70   Temp 98.2 F (36.8 C) (Oral)   Resp 18   SpO2 97%       Physical Exam Vitals and nursing note reviewed.  Constitutional:      General: He is not in acute distress.    Appearance: Normal appearance. He is obese. He is not ill-appearing.  HENT:     Head: Normocephalic and atraumatic.     Mouth/Throat:     Mouth: Mucous membranes are moist.     Pharynx: Oropharynx is clear.  Eyes:     Extraocular Movements: Extraocular movements intact.     Conjunctiva/sclera: Conjunctivae normal.     Pupils: Pupils are equal, round, and reactive to light.  Cardiovascular:     Rate and Rhythm: Normal rate and regular rhythm.     Pulses: Normal pulses.     Heart sounds: Normal heart sounds.  Pulmonary:     Effort: Pulmonary effort is normal. No respiratory distress.     Breath sounds: Rhonchi present. No wheezing or rales.   Musculoskeletal:        General: Tenderness present. No deformity. Normal range of motion.     Cervical back: Normal range of motion and neck supple. No tenderness.     Comments: LS-spine (right-sided inferior) aspect: Moderate TTP over spinous processes, right-sided paraspinous muscles and right superior/inferior spinal erector, exam is limited today due to pain  Lymphadenopathy:     Cervical: No cervical adenopathy.  Skin:    General: Skin is warm and dry.  Neurological:     General: No focal deficit present.     Mental Status: He is alert and oriented to person, place, and time.  Psychiatric:        Mood and Affect: Mood normal.        Behavior: Behavior normal.        Thought Content: Thought content normal.     UC Treatments / Results  Labs (all labs ordered are listed, but only abnormal results are displayed) Labs Reviewed - No data to display  EKG   Radiology DG Lumbar Spine Complete  Result Date: 06/04/2021 CLINICAL DATA:  Lumbar strain.  Back pain. EXAM: LUMBAR SPINE - COMPLETE 4+ VIEW COMPARISON:  None. FINDINGS: 5 nonrib bearing lumbar-type vertebral bodies. Vertebral body heights are maintained. No acute fracture. No static listhesis. No spondylolysis. Minimal degenerative disease with disc height loss at L5-S1. SI joints are unremarkable. IMPRESSION: No acute osseous injury of the lumbar spine. Electronically Signed   By: Elige Ko   On: 06/04/2021 10:02    Procedures Procedures (including critical care time)  Medications Ordered in UC Medications  methylPREDNISolone acetate (DEPO-MEDROL) injection 80 mg (has no administration in time range)    Initial Impression / Assessment and Plan / UC Course  I have reviewed the triage vital signs and the nursing notes.  Pertinent labs & imaging results that were available during my care of the patient were reviewed by me and considered in my medical decision making (see chart for details).    MDM: 1.  Strain of  lumbar region, initial counter-x-ray of lumbar spine negative, IM Depo-Medrol 80 mg given once in clinic today, Rx'd prednisone burst 60 mg daily x5 days, 2.  Muscle spasm of back-Rx Baclofen, advised/encouraged patient to avoid offending activities, movements that  involve affected area of lower back.  Patient discharged home, hemodynamically stable. Final Clinical Impressions(s) / UC Diagnoses   Final diagnoses:  Strain of lumbar region, initial encounter  Muscle spasm of back     Discharge Instructions      Advised patient to take medication as directed with food to completion.  Advised may use baclofen daily, as needed.     ED Prescriptions     Medication Sig Dispense Auth. Provider   predniSONE (DELTASONE) 20 MG tablet Take 3 tabs PO daily x 5 days. 15 tablet Trevor Iha, FNP   baclofen (LIORESAL) 10 MG tablet Take 1 tablet (10 mg total) by mouth 3 (three) times daily. 30 each Trevor Iha, FNP      PDMP not reviewed this encounter.   Cruze, Zingaro, FNP 06/04/21 1025

## 2021-06-04 NOTE — Discharge Instructions (Addendum)
Advised patient to take medication as directed with food to completion.  Advised may use baclofen daily, as needed.

## 2021-06-04 NOTE — ED Triage Notes (Signed)
Pt c/o back pain since this am. Says he bent down to grab belt and felt a pop and severe pain in back. Pain mostly in lower RT back. Pain 10/10 No hx of back problems. Motrin earlier this am.

## 2021-10-24 ENCOUNTER — Other Ambulatory Visit: Payer: Self-pay

## 2021-10-24 ENCOUNTER — Encounter: Payer: Self-pay | Admitting: *Deleted

## 2021-10-24 ENCOUNTER — Emergency Department (INDEPENDENT_AMBULATORY_CARE_PROVIDER_SITE_OTHER)
Admission: EM | Admit: 2021-10-24 | Discharge: 2021-10-24 | Disposition: A | Discharge status: Home / Self Care | Payer: BLUE CROSS/BLUE SHIELD | Source: Home / Self Care | Attending: Family Medicine | Admitting: Family Medicine

## 2021-10-24 DIAGNOSIS — M778 Other enthesopathies, not elsewhere classified: Secondary | ICD-10-CM

## 2021-10-24 DIAGNOSIS — M25531 Pain in right wrist: Secondary | ICD-10-CM | POA: Diagnosis not present

## 2021-10-24 MED ORDER — IBUPROFEN 800 MG PO TABS: 800.0000 mg | ORAL_TABLET | Freq: Three times a day (TID) | ORAL | 0 refills | Status: DC

## 2021-10-24 NOTE — ED Provider Notes (Signed)
[name]    CSN: [id number] Arrival date & time: 10/24/21  6301      History   Chief Complaint Chief Complaint  Patient presents with   Wrist Pain    HPI Jaheim Canino is a 30 y.o. male.   HPI  Patient states he has had right wrist pain intermittently for a month.  He states it was bothering him for 3 days so he got a brace.  He states he wear the brace to work yesterday and they sent him home to have a medical evaluation.  He states that an [facility] PA or nurse practitioner saw him at work yesterday and told him he thought he had tendinitis.  He has been taking ibuprofen as needed.  Wearing a brace as needed.  Today, fortunately he feels well.  Unfortunately this makes diagnosis difficult.  He denies trauma.  He endorses he may have overuse.  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  Requires hand and wrist motion.  Past Medical History:  Diagnosis Date   Morbid obesity (HCC)    MRSA (methicillin resistant staph aureus) culture positive     Patient Active Problem List   Diagnosis Date Noted   Right foot pain 02/22/2015    Past Surgical History:  Procedure Laterality Date   DEBRIDEMENT LEG     for MRSA       Home Medications    Prior to Admission medications   Medication Sig Start Date End Date Taking? Authorizing Provider  Acetaminophen (TYLENOL 8 HOUR PO) Take by mouth.   Yes [provider]  ibuprofen (ADVIL) 800 MG tablet Take 1 tablet (800 mg total) by mouth 3 (three) times daily. 10/24/21  Yes Eustace Moore, MD    Family History Family History  Problem Relation Age of Onset   Diabetes Father    Diabetes Paternal Uncle    Healthy Mother     Social History Social History   Tobacco Use   Smoking status: Never   Smokeless tobacco: Never  Vaping Use   Vaping Use: Never used  Substance Use Topics   Alcohol use: Yes    Alcohol/week: 0.0 standard  drinks    Comment: occ   Drug use: No     Allergies   Patient has no known allergies.   Review of Systems Review of Systems See HPI  Physical Exam Triage Vital Signs ED Triage Vitals  Enc Vitals Group     BP 10/24/21 0938 132/89     Pulse Rate 10/24/21 0938 66     Resp 10/24/21 0938 18     Temp 10/24/21 0938 98.1 F (36.7 C)     Temp Source 10/24/21 0938 Oral     SpO2 10/24/21 0938 99 %     Weight 10/24/21 0942 (!) 380 lb (172.4 kg)     Height 10/24/21 0942 6' (1.829 m)     Head Circumference --      Peak Flow --      Pain Score 10/24/21 0941 1     Pain Loc --      Pain Edu? --      Excl. in GC? --    No data found.  Updated Vital Signs BP 132/89 (BP Location: Left Arm)    Pulse 66    Temp 98.1 F (36.7 C) (Oral)    Resp 18    Ht 6' (  1.829 m)    Wt (!) 172.4 kg    SpO2 99%    BMI 51.54 kg/m      Physical Exam Constitutional:      General: He is not in acute distress.    Appearance: He is well-developed. He is obese.  HENT:     Head: Normocephalic and atraumatic.     Mouth/Throat:     Comments: Patient is wearing mask Eyes:     Conjunctiva/sclera: Conjunctivae normal.     Pupils: Pupils are equal, round, and reactive to light.  Cardiovascular:     Rate and Rhythm: Normal rate.  Pulmonary:     Effort: Pulmonary effort is normal. No respiratory distress.  Abdominal:     General: There is no distension.     Palpations: Abdomen is soft.  Musculoskeletal:        General: Normal range of motion.     Cervical back: Normal range of motion.     Comments: Both wrists appear normal.  There is no soft tissue swelling.  Right wrist has full range of motion.  No tenderness to palpation.  Negative Phalen's Tinel's and Finkelstein's.  Good grip strength.  Normal sensory exam  Skin:    General: Skin is warm and dry.  Neurological:     Mental Status: He is alert.  Psychiatric:        Mood and Affect: Mood normal.        Behavior: Behavior normal.     UC  Treatments / Results  Labs (all labs ordered are listed, but only abnormal results are displayed) Labs Reviewed - No data to display  EKG   Radiology No results found.  Procedures Procedures (including critical care time)  Medications Ordered in UC Medications - No data to display  Initial Impression / Assessment and Plan / UC Course  I have reviewed the triage vital signs and the nursing notes.  Pertinent labs & imaging results that were available during my care of the patient were reviewed by me and considered in my medical decision making (see chart for details).     He describes pain over his radial styloid.  He does not have any pain today.  He may have some wrist tendinitis.  Discussed ice, rest, ibuprofen.  Brace as needed.  Return as needed. Final Clinical Impressions(s) / UC Diagnoses   Final diagnoses:  Right wrist pain  Tendinitis of right wrist     Discharge Instructions      Take ibuprofen 3 times a day with food Wear wrist brace when needed Follow-up if your symptoms worsen   ED Prescriptions     Medication Sig Dispense Auth. Provider   ibuprofen (ADVIL) 800 MG tablet Take 1 tablet (800 mg total) by mouth 3 (three) times daily. 21 tablet Eustace Moore, MD      PDMP not reviewed this encounter.   Eustace Moore, MD 10/24/21 1026

## 2021-10-24 NOTE — ED Triage Notes (Signed)
Pt c/o RT wrist pain x 1 mth intermittently. This time x 3 days. Wearing a brace intermittently and that helps. Denies injury.

## 2021-10-24 NOTE — Discharge Instructions (Signed)
Take ibuprofen 3 times a day with food Wear wrist brace when needed Follow-up if your symptoms worsen

## 2022-03-07 IMAGING — DX DG LUMBAR SPINE COMPLETE 4+V
6 series · 6 of 6 positions shown · non-contrast
Comparison: None.

CLINICAL DATA: Lumbar strain.  Back pain.

EXAM:
LUMBAR SPINE - COMPLETE 4+ VIEW

[l-spine ap]
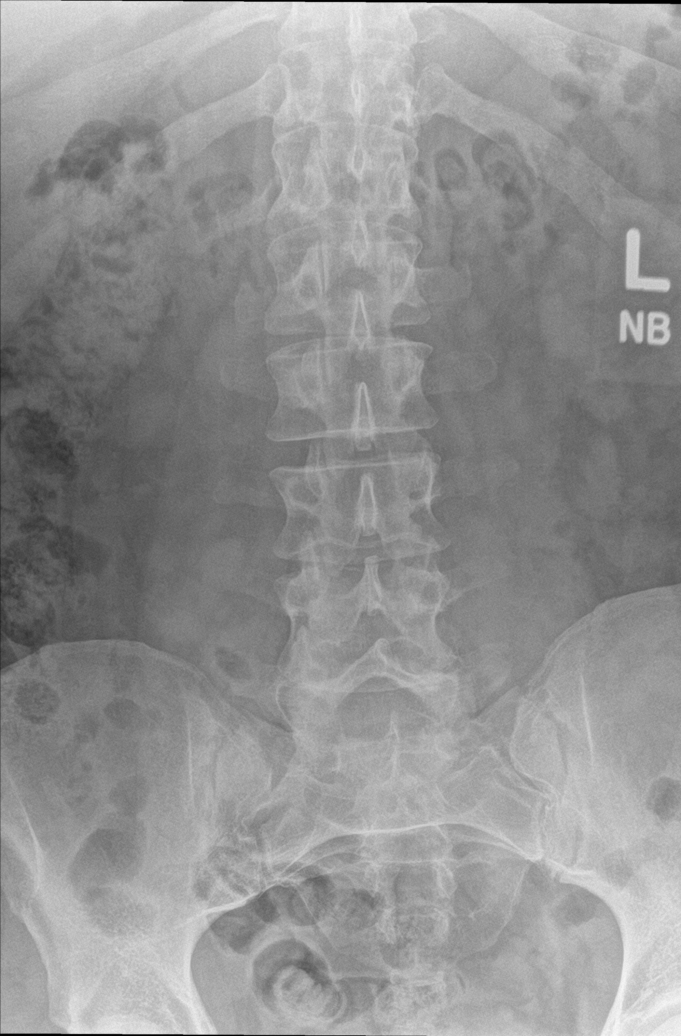

[l-spine obl (1 of 2)]
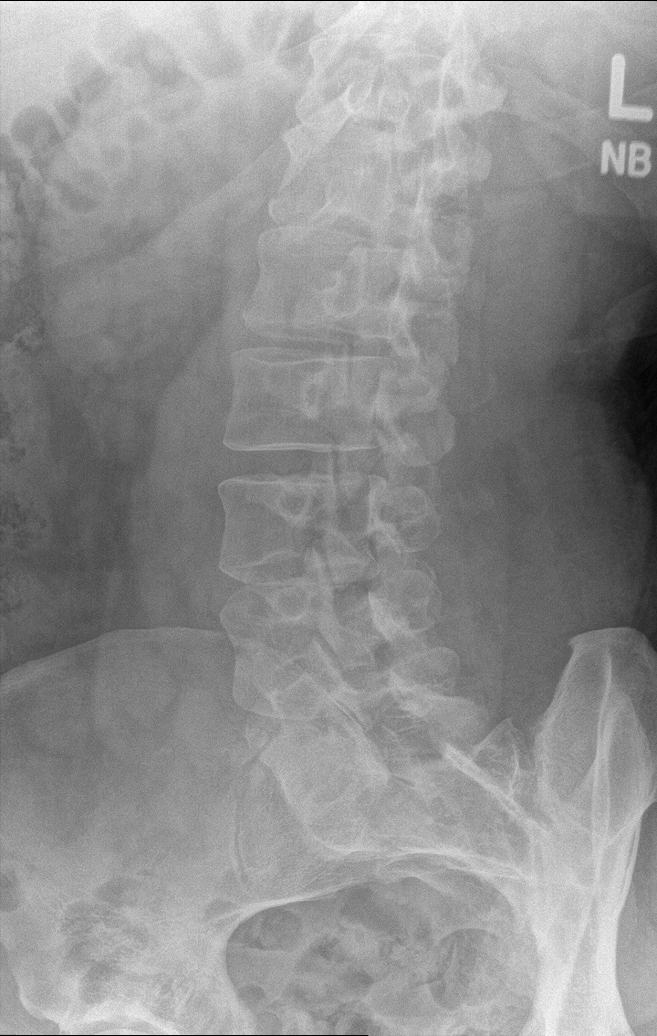

[l-spine obl (2 of 2)]
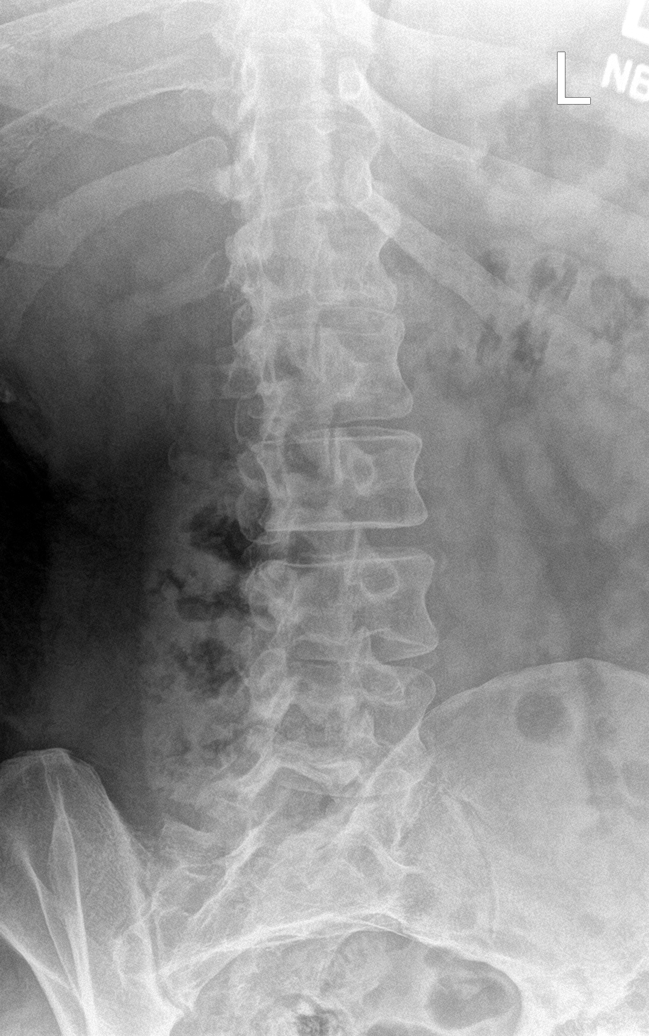

[l-spine lat]
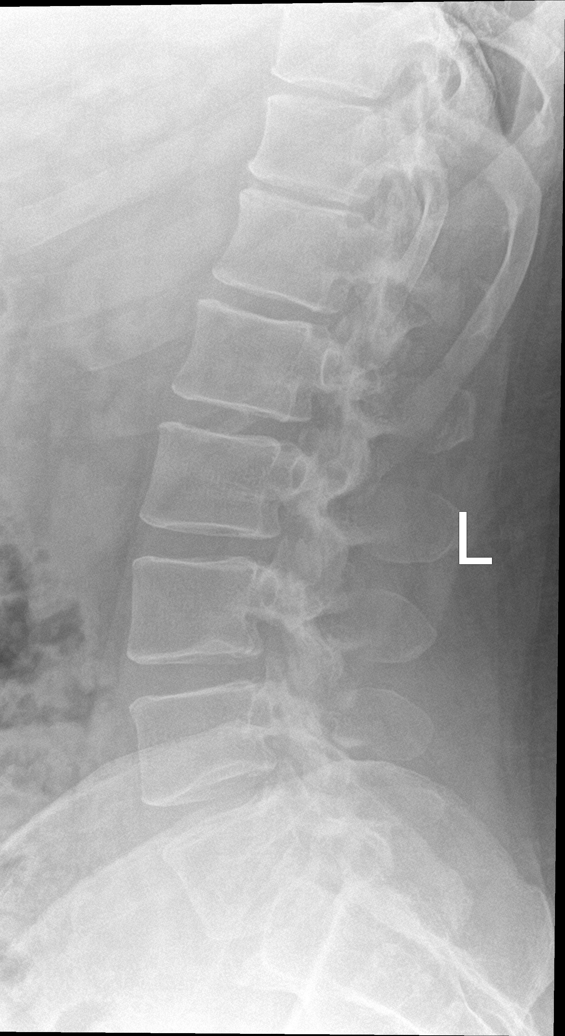

[l-spine spot (1 of 2)]
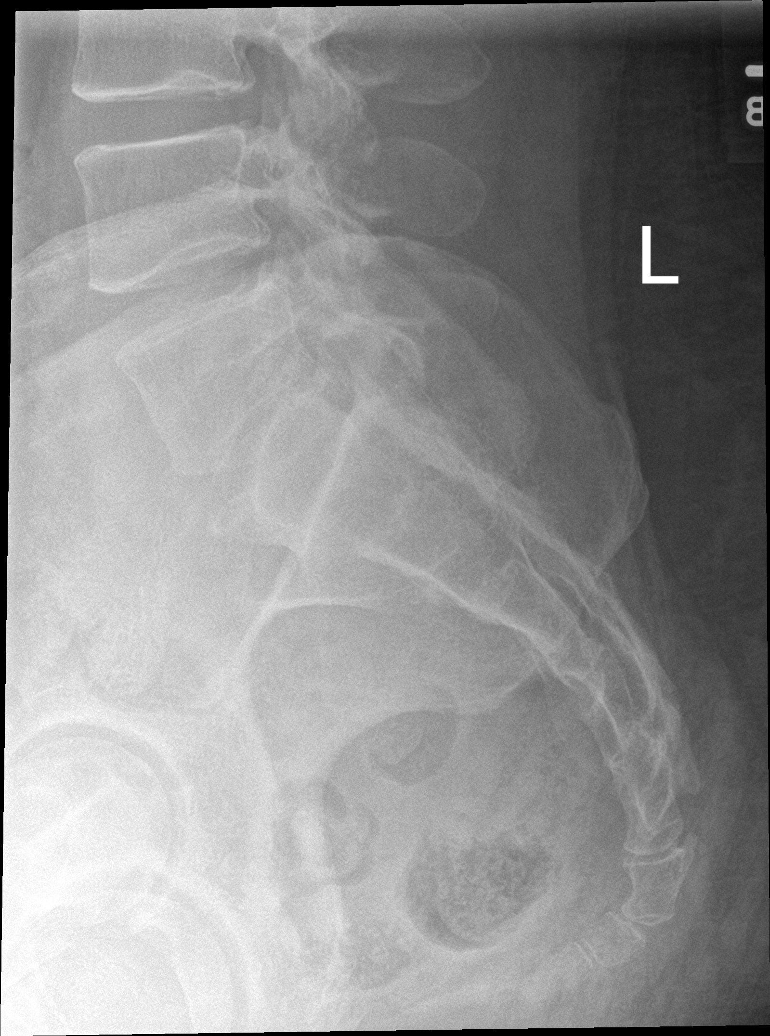

[l-spine spot (2 of 2)]
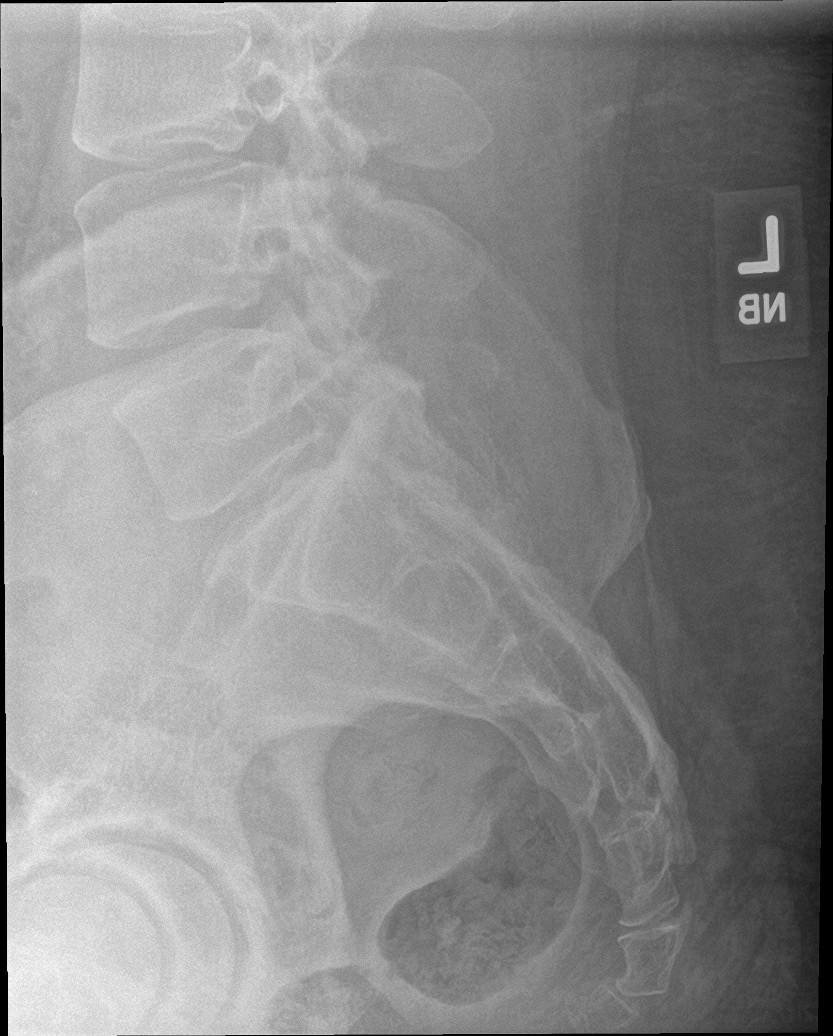

[6 of 6 positions shown; findings below may reference images not displayed]

FINDINGS: 5 nonrib bearing lumbar-type vertebral bodies.

Vertebral body heights are maintained. No acute fracture.

No static listhesis. No spondylolysis.

Minimal degenerative disease with disc height loss at L5-S1.

SI joints are unremarkable.
IMPRESSION: No acute osseous injury of the lumbar spine.

## 2023-05-29 ENCOUNTER — Ambulatory Visit
Admission: EM | Admit: 2023-05-29 | Discharge: 2023-05-29 | Disposition: A | Discharge status: Home / Self Care | Payer: Managed Care, Other (non HMO) | Attending: Family Medicine | Admitting: Family Medicine

## 2023-05-29 DIAGNOSIS — J069 Acute upper respiratory infection, unspecified: Secondary | ICD-10-CM | POA: Diagnosis not present

## 2023-05-29 LAB — POC SARS CORONAVIRUS 2 AG -  ED: SARS Coronavirus 2 Ag: NEGATIVE

## 2023-05-29 MED ORDER — BENZONATATE 100 MG PO CAPS: 100.0000 mg | ORAL_CAPSULE | Freq: Three times a day (TID) | ORAL | 0 refills | Status: AC | PRN

## 2023-05-29 NOTE — Discharge Instructions (Signed)
Your COVID swab was negative today.   Take benzonatate 100 mg, 1 tab every 8 hours as needed for cough.

## 2023-05-29 NOTE — ED Triage Notes (Signed)
Pt presents to [facility] with co of hot flashes, sinus ha, facial congestion since Monday. Pt reports son was sick last week and was negative for everything.

## 2023-05-29 NOTE — ED Provider Notes (Signed)
[facility]    CSN: [id number] Arrival date & time: 05/29/23  1630      History   Chief Complaint Chief Complaint  Patient presents with   URI   Nasal Congestion    HPI Edward Parsons is a 32 y.o. male.    URI Here for nasal congestion, sinus pressure and now cough. Symptoms began on 7/15. No sore throat and no dyspnea or chest heaviness. The sinus headache improved yesterday, and then was worse again today. Starting to cough some today. No f/c. No n/v/d.  Exposed to his son who was ill with similar symptoms about 5 days before pt started symptoms. Son was negative on all swab testing.  PMH neg for DM  Past Medical History:  Diagnosis Date   Morbid obesity (HCC)    MRSA (methicillin resistant staph aureus) culture positive     Patient Active Problem List   Diagnosis Date Noted   Right foot pain 02/22/2015    Past Surgical History:  Procedure Laterality Date   DEBRIDEMENT LEG     for MRSA       Home Medications    Prior to Admission medications   Medication Sig Start Date End Date Taking? Authorizing Provider  benzonatate (TESSALON) 100 MG capsule Take 1 capsule (100 mg total) by mouth 3 (three) times daily as needed for cough. 05/29/23  Yes Zenia Resides, MD  Acetaminophen (TYLENOL 8 HOUR PO) Take by mouth.    [provider]    Family History Family History  Problem Relation Age of Onset   Diabetes Father    Diabetes Paternal Uncle    Healthy Mother     Social History Social History   Tobacco Use   Smoking status: Never   Smokeless tobacco: Never  Vaping Use   Vaping status: Never Used  Substance Use Topics   Alcohol use: Yes    Alcohol/week: 0.0 standard drinks of alcohol    Comment: occ   Drug use: No     Allergies   Patient has no known allergies.   Review of Systems Review of Systems   Physical Exam Triage Vital Signs ED Triage Vitals  Encounter Vitals Group     BP 05/29/23 1639 (S) (!) 94/59      Systolic BP Percentile --      Diastolic BP Percentile --      Pulse Rate 05/29/23 1636 72     Resp 05/29/23 1636 16     Temp 05/29/23 1636 97.7 F (36.5 C)     Temp src --      SpO2 05/29/23 1636 97 %     Weight --      Height --      Head Circumference --      Peak Flow --      Pain Score 05/29/23 1635 0     Pain Loc --      Pain Education --      Exclude from Growth Chart --    No data found.  Updated Vital Signs BP (S) (!) 94/59 (BP Location: Left Arm)   Pulse 72   Temp 97.7 F (36.5 C)   Resp 16   SpO2 97%   Visual Acuity Right Eye Distance:   Left Eye Distance:   Bilateral Distance:    Right Eye Near:   Left Eye Near:    Bilateral Near:     Physical Exam Vitals reviewed.  Constitutional:      General:  He is not in acute distress.    Appearance: He is not toxic-appearing.  HENT:     Right Ear: Tympanic membrane and ear canal normal.     Left Ear: Tympanic membrane and ear canal normal.     Nose: Congestion present.     Mouth/Throat:     Mouth: Mucous membranes are moist.     Comments: Mild erythema of posterior OP  Eyes:     Extraocular Movements: Extraocular movements intact.     Conjunctiva/sclera: Conjunctivae normal.     Pupils: Pupils are equal, round, and reactive to light.  Cardiovascular:     Rate and Rhythm: Normal rate and regular rhythm.     Heart sounds: No murmur heard. Pulmonary:     Effort: No respiratory distress.     Breath sounds: No stridor. No wheezing, rhonchi or rales.  Musculoskeletal:     Cervical back: Neck supple.  Lymphadenopathy:     Cervical: No cervical adenopathy.  Skin:    Capillary Refill: Capillary refill takes less than 2 seconds.     Coloration: Skin is not jaundiced or pale.  Neurological:     General: No focal deficit present.     Mental Status: He is alert and oriented to person, place, and time.  Psychiatric:        Behavior: Behavior normal.      UC Treatments / Results  Labs (all labs ordered  are listed, but only abnormal results are displayed) Labs Reviewed  POC SARS CORONAVIRUS 2 AG -  ED    EKG   Radiology No results found.  Procedures Procedures (including critical care time)  Medications Ordered in UC Medications - No data to display  Initial Impression / Assessment and Plan / UC Course  I have reviewed the triage vital signs and the nursing notes.  Pertinent labs & imaging results that were available during my care of the patient were reviewed by me and considered in my medical decision making (see chart for details).        Covid swab is negative. Tessalon perles sent for the cough. Final Clinical Impressions(s) / UC Diagnoses   Final diagnoses:  Viral upper respiratory tract infection     Discharge Instructions      Your COVID swab was negative today.   Take benzonatate 100 mg, 1 tab every 8 hours as needed for cough.      ED Prescriptions     Medication Sig Dispense Auth. Provider   benzonatate (TESSALON) 100 MG capsule Take 1 capsule (100 mg total) by mouth 3 (three) times daily as needed for cough. 21 capsule Zenia Resides, MD      PDMP not reviewed this encounter.   Zenia Resides, MD 05/29/23 [id number]

## 2024-07-30 ENCOUNTER — Ambulatory Visit
Admission: EM | Admit: 2024-07-30 | Discharge: 2024-07-30 | Disposition: A | Discharge status: Home / Self Care | Attending: Family Medicine | Admitting: Family Medicine

## 2024-07-30 DIAGNOSIS — R059 Cough, unspecified: Secondary | ICD-10-CM

## 2024-07-30 DIAGNOSIS — J069 Acute upper respiratory infection, unspecified: Secondary | ICD-10-CM | POA: Diagnosis not present

## 2024-07-30 MED ORDER — PREDNISONE 10 MG (21) PO TBPK: ORAL_TABLET | Freq: Every day | ORAL | 0 refills | Status: AC

## 2024-07-30 MED ORDER — AMOXICILLIN-POT CLAVULANATE 875-125 MG PO TABS: 1.0000 | ORAL_TABLET | Freq: Two times a day (BID) | ORAL | 0 refills | Status: AC

## 2024-07-30 NOTE — ED Provider Notes (Signed)
 [facility]    CSN: [id number] Arrival date & time: 07/30/24  1032      History   Chief Complaint Chief Complaint  Patient presents with   URI    HPI Edward Parsons is a 33 y.o. male.   HPI 33 year old male presents with cough, wheezing, and congestion for 8 days.  PMH significant for morbid obesity.  Past Medical History:  Diagnosis Date   Morbid obesity (HCC)    MRSA (methicillin resistant staph aureus) culture positive     Patient Active Problem List   Diagnosis Date Noted   Right foot pain 02/22/2015    Past Surgical History:  Procedure Laterality Date   DEBRIDEMENT LEG     for MRSA       Home Medications    Prior to Admission medications   Medication Sig Start Date End Date Taking? Authorizing Provider  amoxicillin -clavulanate (AUGMENTIN ) 875-125 MG tablet Take 1 tablet by mouth every 12 (twelve) hours. 07/30/24  Yes Teddy Ozell, FNP  predniSONE  (STERAPRED UNI-PAK 21 TAB) 10 MG (21) TBPK tablet Take by mouth daily. Take 6 tabs by mouth daily  for 2 days, then 5 tabs for 2 days, then 4 tabs for 2 days, then 3 tabs for 2 days, 2 tabs for 2 days, then 1 tab by mouth daily for 2 days 07/30/24  Yes Teddy Ozell, FNP  Acetaminophen (TYLENOL 8 HOUR PO) Take by mouth.    [provider]  benzonatate  (TESSALON ) 100 MG capsule Take 1 capsule (100 mg total) by mouth 3 (three) times daily as needed for cough. 05/29/23   Vonna Sharlet POUR, MD    Family History Family History  Problem Relation Age of Onset   Diabetes Father    Diabetes Paternal Uncle    Healthy Mother     Social History Social History   Tobacco Use   Smoking status: Never   Smokeless tobacco: Never  Vaping Use   Vaping status: Never Used  Substance Use Topics   Alcohol use: Yes    Alcohol/week: 0.0 standard drinks of alcohol    Comment: occ   Drug use: No     Allergies   Patient has no known allergies.   Review of Systems Review of Systems  HENT:  Positive  for congestion.   Respiratory:  Positive for shortness of breath and wheezing.      Physical Exam Triage Vital Signs ED Triage Vitals  Encounter Vitals Group     BP      Girls Systolic BP Percentile      Girls Diastolic BP Percentile      Boys Systolic BP Percentile      Boys Diastolic BP Percentile      Pulse      Resp      Temp      Temp src      SpO2      Weight      Height      Head Circumference      Peak Flow      Pain Score      Pain Loc      Pain Education      Exclude from Growth Chart    No data found.  Updated Vital Signs BP (!) 152/100   Pulse 65   Temp 98.3 F (36.8 C)   Resp 19   SpO2 95%    Physical Exam Vitals and nursing note reviewed.  Constitutional:      Appearance:  Normal appearance. He is obese.  HENT:     Head: Normocephalic and atraumatic.     Right Ear: Tympanic membrane, ear canal and external ear normal.     Left Ear: Tympanic membrane, ear canal and external ear normal.     Mouth/Throat:     Mouth: Mucous membranes are moist.     Pharynx: Oropharynx is clear.  Eyes:     Extraocular Movements: Extraocular movements intact.     Pupils: Pupils are equal, round, and reactive to light.  Cardiovascular:     Rate and Rhythm: Normal rate and regular rhythm.     Pulses: Normal pulses.     Heart sounds: Normal heart sounds.  Pulmonary:     Effort: Pulmonary effort is normal.     Breath sounds: Normal breath sounds. No wheezing, rhonchi or rales.     Comments: Infrequent nonproductive cough on exam Musculoskeletal:        General: Normal range of motion.     Cervical back: Normal range of motion and neck supple.  Skin:    General: Skin is warm and dry.  Neurological:     General: No focal deficit present.     Mental Status: He is alert and oriented to person, place, and time. Mental status is at baseline.  Psychiatric:        Mood and Affect: Mood normal.        Behavior: Behavior normal.        Thought Content: Thought content  normal.      UC Treatments / Results  Labs (all labs ordered are listed, but only abnormal results are displayed) Labs Reviewed - No data to display  EKG   Radiology No results found.  Procedures Procedures (including critical care time)  Medications Ordered in UC Medications - No data to display  Initial Impression / Assessment and Plan / UC Course  I have reviewed the triage vital signs and the nursing notes.  Pertinent labs & imaging results that were available during my care of the patient were reviewed by me and considered in my medical decision making (see chart for details).     MDM: 1.  Acute URI-Rx'd Augmentin  875/125 mg tablet: Take 1 tablet twice daily x 7 days; 2.  Cough, unspecified type-Rx'd Sterapred Unipak-(42 tab 10 mg taper). Advised patient to take medications as directed with food to completion.  Advised patient to take prednisone  with first dose of Augmentin  to completion.  Encouraged to increase daily water intake to 64 ounces per day while taking these medications.  Advised if symptoms worsen and/or unresolved please follow-up with your PCP or here for further evaluation.  Patient discharged home, hemodynamically stable Final Clinical Impressions(s) / UC Diagnoses   Final diagnoses:  Acute URI  Cough, unspecified type     Discharge Instructions      Advised patient to take medications as directed with food to completion.  Advised patient to take prednisone  with first dose of Augmentin  to completion.  Encouraged to increase daily water intake to 64 ounces per day while taking these medications.  Advised if symptoms worsen and/or unresolved please follow-up with your PCP or here for further evaluation.     ED Prescriptions     Medication Sig Dispense Auth. Provider   amoxicillin -clavulanate (AUGMENTIN ) 875-125 MG tablet Take 1 tablet by mouth every 12 (twelve) hours. 14 tablet Daveena Elmore, FNP   predniSONE  (STERAPRED UNI-PAK 21 TAB) 10 MG (21)  TBPK tablet Take by mouth daily.  Take 6 tabs by mouth daily  for 2 days, then 5 tabs for 2 days, then 4 tabs for 2 days, then 3 tabs for 2 days, 2 tabs for 2 days, then 1 tab by mouth daily for 2 days 42 tablet Teddy Sharper, FNP      PDMP not reviewed this encounter.   Giani, Betzold, FNP 07/30/24 1124

## 2024-07-30 NOTE — ED Triage Notes (Signed)
 Pt presents to uc with co cough, sob, wheezing for 8 days. Pt reports cough is productive and dark brown tinged. He has been taking musinex.

## 2024-07-30 NOTE — Discharge Instructions (Addendum)
 Advised patient to take medications as directed with food to completion.  Advised patient to take prednisone with first dose of Augmentin to completion.  Encouraged to increase daily water intake to 64 ounces per day while taking these medications.  Advised if symptoms worsen and/or unresolved please follow-up with your PCP or here for further evaluation.
